# Patient Record
Sex: Female | Born: 1980 | Race: White | Hispanic: No | Smoking: Never smoker
Health system: Southern US, Community
[De-identification: ages and names within clinical notes are randomized; demographics above are authoritative.]

## PROBLEM LIST (undated history)

## (undated) ENCOUNTER — Inpatient Hospital Stay (HOSPITAL_COMMUNITY): Payer: Self-pay

## (undated) DIAGNOSIS — H9319 Tinnitus, unspecified ear: Secondary | ICD-10-CM

## (undated) DIAGNOSIS — D6852 Prothrombin gene mutation: Principal | ICD-10-CM

## (undated) DIAGNOSIS — D649 Anemia, unspecified: Secondary | ICD-10-CM

## (undated) DIAGNOSIS — I10 Essential (primary) hypertension: Secondary | ICD-10-CM

## (undated) DIAGNOSIS — Z789 Other specified health status: Secondary | ICD-10-CM

## (undated) DIAGNOSIS — R112 Nausea with vomiting, unspecified: Secondary | ICD-10-CM

## (undated) DIAGNOSIS — D682 Hereditary deficiency of other clotting factors: Secondary | ICD-10-CM

## (undated) DIAGNOSIS — E7212 Methylenetetrahydrofolate reductase deficiency: Secondary | ICD-10-CM

## (undated) DIAGNOSIS — Z9889 Other specified postprocedural states: Secondary | ICD-10-CM

## (undated) DIAGNOSIS — K219 Gastro-esophageal reflux disease without esophagitis: Secondary | ICD-10-CM

## (undated) DIAGNOSIS — O99281 Endocrine, nutritional and metabolic diseases complicating pregnancy, first trimester: Secondary | ICD-10-CM

## (undated) DIAGNOSIS — R7989 Other specified abnormal findings of blood chemistry: Secondary | ICD-10-CM

## (undated) DIAGNOSIS — D219 Benign neoplasm of connective and other soft tissue, unspecified: Secondary | ICD-10-CM

## (undated) DIAGNOSIS — D759 Disease of blood and blood-forming organs, unspecified: Secondary | ICD-10-CM

## (undated) DIAGNOSIS — N390 Urinary tract infection, site not specified: Secondary | ICD-10-CM

## (undated) HISTORY — PX: OTHER SURGICAL HISTORY: SHX169

## (undated) HISTORY — PX: WISDOM TOOTH EXTRACTION: SHX21

## (undated) HISTORY — DX: Benign neoplasm of connective and other soft tissue, unspecified: D21.9

## (undated) HISTORY — DX: Prothrombin gene mutation: D68.52

---
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
## 2004-09-11 ENCOUNTER — Encounter: Admission: RE | Admit: 2004-09-11 | Discharge: 2004-09-11 | Payer: Self-pay | Admitting: Obstetrics & Gynecology

## 2005-02-26 ENCOUNTER — Ambulatory Visit: Payer: Self-pay | Admitting: Cardiology

## 2005-02-26 ENCOUNTER — Ambulatory Visit: Payer: Self-pay | Admitting: Family Medicine

## 2005-03-26 ENCOUNTER — Encounter: Admission: RE | Admit: 2005-03-26 | Discharge: 2005-03-26 | Payer: Self-pay | Admitting: Family Medicine

## 2005-09-09 ENCOUNTER — Other Ambulatory Visit: Admission: RE | Admit: 2005-09-09 | Discharge: 2005-09-09 | Payer: Self-pay | Admitting: Obstetrics and Gynecology

## 2010-10-09 ENCOUNTER — Encounter: Payer: Self-pay | Admitting: Family Medicine

## 2010-10-09 ENCOUNTER — Ambulatory Visit (INDEPENDENT_AMBULATORY_CARE_PROVIDER_SITE_OTHER): Payer: 59 | Admitting: Family Medicine

## 2010-10-09 VITALS — BP 118/76 | Temp 98.1°F | Ht 64.0 in | Wt 266.4 lb

## 2010-10-09 DIAGNOSIS — R51 Headache: Secondary | ICD-10-CM

## 2010-10-09 NOTE — Patient Instructions (Signed)
We'll notify you of your CT results and determine what the next step is Call with any questions or concerns Hang in there!!!

## 2010-10-09 NOTE — Progress Notes (Signed)
  Subjective:    Patient ID: [name], female    DOB: 1980-06-04, 30 y.o.   MRN: [id number]  HPI New to establish care.  Last saw Dr Laury Axon ~6 yrs ago.  Seeing GYN- Lowell Guitar, [facility]  HA- woke up Monday and got up to use the bathroom, developed severe pain on L side of head.  Pain resolved w/ rest.  Pain recurred on Tuesday afternoon when pt laughed.  Yesterday when pt was leaning forward developed severe pain behind L eye.  No elevation in BP.  Denies HA in between painful episodes.  No nausea or vomiting.  Denies sinus pain/pressure.  Denies focal weakness or numbness.  Motion makes pain worse.  No hx of similar.  Denies family hx of brain tumor or other illness.   Review of Systems For ROS see HPI     Objective:   Physical Exam  Constitutional: She appears well-developed and well-nourished. No distress.  HENT:  Head: Normocephalic and atraumatic.  Right Ear: Tympanic membrane normal.  Left Ear: Tympanic membrane normal.  Nose: No mucosal edema or rhinorrhea. Right sinus exhibits no maxillary sinus tenderness and no frontal sinus tenderness. Left sinus exhibits no maxillary sinus tenderness and no frontal sinus tenderness.  Mouth/Throat: Oropharynx is clear and moist.  Eyes: Conjunctivae and EOM are normal. Pupils are equal, round, and reactive to light.       No obvious abnormalities of fundi  Neck: Normal range of motion. Neck supple. No thyromegaly present.  Cardiovascular: Normal rate, regular rhythm, normal heart sounds and intact distal pulses.   No murmur heard. Pulmonary/Chest: Effort normal and breath sounds normal. No respiratory distress. She has no wheezes.  Lymphadenopathy:    She has no cervical adenopathy.  Neurological: She is alert. She has normal strength and normal reflexes. She displays no tremor. No cranial nerve deficit or sensory deficit. She displays a negative Romberg sign. Coordination and gait normal.  Psychiatric:       Mildly anxious           Assessment & Plan:

## 2010-10-10 ENCOUNTER — Ambulatory Visit (HOSPITAL_BASED_OUTPATIENT_CLINIC_OR_DEPARTMENT_OTHER)
Admission: RE | Admit: 2010-10-10 | Discharge: 2010-10-10 | Disposition: A | Discharge status: Home / Self Care | Payer: 59 | Source: Ambulatory Visit | Attending: Family Medicine | Admitting: Family Medicine

## 2010-10-10 DIAGNOSIS — R51 Headache: Secondary | ICD-10-CM | POA: Insufficient documentation

## 2010-10-10 DIAGNOSIS — H547 Unspecified visual loss: Secondary | ICD-10-CM

## 2010-10-10 DIAGNOSIS — H538 Other visual disturbances: Secondary | ICD-10-CM | POA: Insufficient documentation

## 2010-10-13 ENCOUNTER — Telehealth: Payer: Self-pay | Admitting: *Deleted

## 2010-10-13 DIAGNOSIS — R51 Headache: Secondary | ICD-10-CM

## 2010-10-13 NOTE — Telephone Encounter (Signed)
Discuss with patient, Pt states that symptom still have not gotten better and would like to be referred. Pt notes that she had a episode on yesterday like a migraine with all the symptoms with no pain. Pt aware referral put in awaiting appt info   Nothing acute seen on MRI that would account for pain- this is good news! Small cyst seen on R (opposite side from pain) has been present since birth and is no cause for concern. If pain continues- will refer to neuro for further evaluation

## 2010-10-20 NOTE — Assessment & Plan Note (Signed)
Given acuteness and severity of episodes will send pt for brain imaging to r/o mass or other abnormality.  Neuro exam today WNL.  sxs not consistent w/ sinus problem.  Will follow closely.

## 2010-11-03 ENCOUNTER — Ambulatory Visit (INDEPENDENT_AMBULATORY_CARE_PROVIDER_SITE_OTHER): Payer: 59 | Admitting: Family Medicine

## 2010-11-03 DIAGNOSIS — N390 Urinary tract infection, site not specified: Secondary | ICD-10-CM

## 2010-11-03 DIAGNOSIS — R319 Hematuria, unspecified: Secondary | ICD-10-CM | POA: Insufficient documentation

## 2010-11-03 DIAGNOSIS — R5383 Other fatigue: Secondary | ICD-10-CM

## 2010-11-03 LAB — POCT URINALYSIS DIPSTICK
Blood, UA: NEGATIVE
Nitrite, UA: NEGATIVE
Protein, UA: NEGATIVE
Spec Grav, UA: 1.01
Urobilinogen, UA: 0.2
pH, UA: 5

## 2010-11-03 LAB — CBC WITH DIFFERENTIAL/PLATELET
Basophils Relative: 0.7 % (ref 0.0–3.0)
Eosinophils Relative: 2.7 % (ref 0.0–5.0)
HCT: 38.6 % (ref 36.0–46.0)
Hemoglobin: 13.1 g/dL (ref 12.0–15.0)
Lymphs Abs: 2.4 10*3/uL (ref 0.7–4.0)
MCHC: 34 g/dL (ref 30.0–36.0)
MCV: 81.5 fl (ref 78.0–100.0)
Monocytes Absolute: 0.5 10*3/uL (ref 0.1–1.0)
Neutro Abs: 5 10*3/uL (ref 1.4–7.7)
Neutrophils Relative %: 61.6 % (ref 43.0–77.0)
RBC: 4.73 Mil/uL (ref 3.87–5.11)
WBC: 8.1 10*3/uL (ref 4.5–10.5)

## 2010-11-03 NOTE — Assessment & Plan Note (Signed)
Pt's fatigue may be related to current UTI although sxs seem out of proportion to current illness.  Check labs to r/o underlying abnormality.

## 2010-11-03 NOTE — Assessment & Plan Note (Signed)
Pt already on Cipro for UTI and UA again shows leuks but no blood this time.  sxs are improving.  Will culture urine.  Pt to complete abx and call me if sxs change or worsen.

## 2010-11-03 NOTE — Progress Notes (Signed)
  Subjective:    Patient ID: [name], female    DOB: 11-28-1980, 30 y.o.   MRN: [id number]  HPI Hematuria- Friday woke up w/ severe pain in lower abd/pelvic.  Went to see GYN and was told she had kidney stone.  No CT, xray or US done.  Denies pain w/ urination.  Had microscopic blood in UA and was told she had UTI- started at Cipro.  Now having 'bloating' w/ urination and R sided low back pain.  Fatigue- 2 weeks of sxs.  Reports she will wake up, take a shower and then go back to bed.  This is not typical for pt.   Review of Systems For ROS see HPI     Objective:   Physical Exam  Constitutional: She appears well-developed and well-nourished. No distress.  HENT:  Head: Normocephalic and atraumatic.  Neck: Normal range of motion. Neck supple. No thyromegaly present.  Cardiovascular: Normal rate, regular rhythm, normal heart sounds and intact distal pulses.   Pulmonary/Chest: Effort normal and breath sounds normal. No respiratory distress.  Abdominal: Soft. Bowel sounds are normal. She exhibits no distension. There is no tenderness (suprapubic or CVA).  Lymphadenopathy:    She has no cervical adenopathy.  Skin: Skin is warm and dry.          Assessment & Plan:

## 2010-11-03 NOTE — Patient Instructions (Signed)
This appears to be a UTI We will culture your urine and call you with those results Finish the Cipro as directed Drink plenty of fluids Tylenol or ibuprofen as needed for pain If you have any questions or concerns- call me! Hang in there!!

## 2010-11-09 ENCOUNTER — Encounter: Payer: Self-pay | Admitting: *Deleted

## 2010-11-23 ENCOUNTER — Emergency Department (HOSPITAL_COMMUNITY): Payer: 59

## 2010-11-23 ENCOUNTER — Ambulatory Visit: Payer: 59 | Admitting: Family Medicine

## 2010-11-23 ENCOUNTER — Emergency Department (HOSPITAL_COMMUNITY)
Admission: EM | Admit: 2010-11-23 | Discharge: 2010-11-23 | Disposition: A | Discharge status: Home / Self Care | Payer: 59 | Attending: Emergency Medicine | Admitting: Emergency Medicine

## 2010-11-23 ENCOUNTER — Encounter (HOSPITAL_COMMUNITY): Payer: Self-pay | Admitting: Radiology

## 2010-11-23 DIAGNOSIS — N390 Urinary tract infection, site not specified: Secondary | ICD-10-CM | POA: Insufficient documentation

## 2010-11-23 DIAGNOSIS — R109 Unspecified abdominal pain: Secondary | ICD-10-CM | POA: Insufficient documentation

## 2010-11-23 DIAGNOSIS — N898 Other specified noninflammatory disorders of vagina: Secondary | ICD-10-CM | POA: Insufficient documentation

## 2010-11-23 LAB — COMPREHENSIVE METABOLIC PANEL
ALT: 18 U/L (ref 0–35)
AST: 25 U/L (ref 0–37)
Albumin: 3.9 g/dL (ref 3.5–5.2)
Alkaline Phosphatase: 81 U/L (ref 39–117)
Potassium: 4.6 mEq/L (ref 3.5–5.1)
Sodium: 136 mEq/L (ref 135–145)
Total Protein: 7.5 g/dL (ref 6.0–8.3)

## 2010-11-23 LAB — WET PREP, GENITAL

## 2010-11-23 LAB — URINE MICROSCOPIC-ADD ON

## 2010-11-23 LAB — DIFFERENTIAL
Basophils Absolute: 0.1 10*3/uL (ref 0.0–0.1)
Basophils Relative: 1 % (ref 0–1)
Eosinophils Relative: 2 % (ref 0–5)
Monocytes Absolute: 0.6 10*3/uL (ref 0.1–1.0)
Neutro Abs: 5.5 10*3/uL (ref 1.7–7.7)

## 2010-11-23 LAB — CBC
MCHC: 32.5 g/dL (ref 30.0–36.0)
RDW: 13.6 % (ref 11.5–15.5)

## 2010-11-23 LAB — URINALYSIS, ROUTINE W REFLEX MICROSCOPIC
Glucose, UA: NEGATIVE mg/dL
Hgb urine dipstick: NEGATIVE
Ketones, ur: NEGATIVE mg/dL
pH: 6 (ref 5.0–8.0)

## 2010-11-23 MED ORDER — IOHEXOL 300 MG/ML  SOLN
125.0000 mL | Freq: Once | INTRAMUSCULAR | Status: AC | PRN
  Administered 2010-11-23: 125 mL via INTRAVENOUS

## 2010-11-24 LAB — GC/CHLAMYDIA PROBE AMP, GENITAL
Chlamydia, DNA Probe: NEGATIVE
GC Probe Amp, Genital: NEGATIVE

## 2011-01-25 ENCOUNTER — Other Ambulatory Visit: Payer: Self-pay | Admitting: Obstetrics and Gynecology

## 2011-01-25 DIAGNOSIS — R102 Pelvic and perineal pain: Secondary | ICD-10-CM

## 2011-02-03 ENCOUNTER — Ambulatory Visit
Admission: RE | Admit: 2011-02-03 | Discharge: 2011-02-03 | Disposition: A | Discharge status: Home / Self Care | Payer: 59 | Source: Ambulatory Visit | Attending: Obstetrics and Gynecology | Admitting: Obstetrics and Gynecology

## 2011-02-03 DIAGNOSIS — R102 Pelvic and perineal pain: Secondary | ICD-10-CM

## 2011-02-03 MED ORDER — GADOBENATE DIMEGLUMINE 529 MG/ML IV SOLN
20.0000 mL | Freq: Once | INTRAVENOUS | Status: AC | PRN
  Administered 2011-02-03: 20 mL via INTRAVENOUS

## 2011-05-07 ENCOUNTER — Ambulatory Visit: Payer: 59 | Admitting: Internal Medicine

## 2011-06-06 ENCOUNTER — Inpatient Hospital Stay (HOSPITAL_COMMUNITY): Payer: 59

## 2011-06-06 ENCOUNTER — Inpatient Hospital Stay (HOSPITAL_COMMUNITY)
Admission: AD | Admit: 2011-06-06 | Discharge: 2011-06-06 | Disposition: A | Discharge status: Home / Self Care | Payer: 59 | Source: Ambulatory Visit | Attending: Obstetrics | Admitting: Obstetrics

## 2011-06-06 ENCOUNTER — Encounter (HOSPITAL_COMMUNITY): Payer: Self-pay | Admitting: *Deleted

## 2011-06-06 DIAGNOSIS — N949 Unspecified condition associated with female genital organs and menstrual cycle: Secondary | ICD-10-CM | POA: Insufficient documentation

## 2011-06-06 DIAGNOSIS — R109 Unspecified abdominal pain: Secondary | ICD-10-CM | POA: Insufficient documentation

## 2011-06-06 HISTORY — DX: Urinary tract infection, site not specified: N39.0

## 2011-06-06 HISTORY — DX: Benign neoplasm of connective and other soft tissue, unspecified: D21.9

## 2011-06-06 LAB — CBC
HCT: 39.4 % (ref 36.0–46.0)
MCHC: 33.5 g/dL (ref 30.0–36.0)
Platelets: 272 10*3/uL (ref 150–400)
RDW: 14.3 % (ref 11.5–15.5)
WBC: 13.5 10*3/uL — ABNORMAL HIGH (ref 4.0–10.5)

## 2011-06-06 LAB — URINALYSIS, ROUTINE W REFLEX MICROSCOPIC
Bilirubin Urine: NEGATIVE
Ketones, ur: NEGATIVE mg/dL
Nitrite: NEGATIVE
Urobilinogen, UA: 0.2 mg/dL (ref 0.0–1.0)
pH: 8.5 — ABNORMAL HIGH (ref 5.0–8.0)

## 2011-06-06 NOTE — ED Provider Notes (Signed)
History   Chronic pelvic pain with exacerbation  Chief Complaint  Patient presents with  . Abdominal Pain   HPI Note dictated  OB History    Grav Para Term Preterm Abortions TAB SAB Ect Mult Living   0               Past Medical History  Diagnosis Date  . Fibroids   . UTI (lower urinary tract infection)     Past Surgical History  Procedure Date  . Breast cyst removed   . Wisdom tooth extraction     Family History  Problem Relation Age of Onset  . Heart disease      grandfather  . Diabetes      grandfather  . Hypertension      grandfather  . Alcohol abuse Mother   . Alcohol abuse Maternal Grandfather     History  Substance Use Topics  . Smoking status: Never Smoker   . Smokeless tobacco: Not on file  . Alcohol Use: No     rarely    Allergies: No Known Allergies  Prescriptions prior to admission  Medication Sig Dispense Refill  . HYDROcodone-acetaminophen (VICODIN) 5-500 MG per tablet Take 1 tablet by mouth every 6 (six) hours as needed.        . traMADol (ULTRAM) 50 MG tablet Take 50 mg by mouth every 6 (six) hours as needed. For pain       . Meth-Hyo-M Bl-Na Phos-Ph Sal (URIBEL) 118 MG CAPS Take by mouth 3 (three) times daily.          ROS Physical Exam   Blood pressure 140/82, pulse 100, temperature 98.7 F (37.1 C), temperature source Oral, resp. rate 20, height 5\' 4"  (1.626 m), weight 118.842 kg (262 lb), last menstrual period 03/17/2011.  Physical Exam dictated  MAU Course  Procedures  MDM na  Assessment and Plan  Chronic pelvic pain with exacerbation UPT neg and UA neg DC home pending sono and CBC Has Tramadol and Vicodin for pain management Surgery for fibroid pending.  Jaisen Wiltrout J 06/06/2011, 4:43 PM

## 2011-06-06 NOTE — Progress Notes (Signed)
Onset of abdominal pain was seen at urgent care and was told does not have UTI, pain is bilateral lower abdominal pain, has history of pelvic pain, hurts when has bowel movement and urinating, LMP 03/15/11.

## 2011-06-06 NOTE — Progress Notes (Signed)
Pt c/o sever lower abd pain since yesterday morning that worsened throughout the day.  States she has muscle spasm pain that goes around to low back when it occurs. Pt took a tramadol and vicodin last night with minimal relief.  Went to urgent care yesterday and was treated with tramadol.

## 2011-06-07 NOTE — H&P (Signed)
[name], UMLAND                ACCOUNT NO.:  [id number]  MEDICAL RECORD NO.:  [id number]  LOCATION:  MV78                          FACILITY:  [facility]  PHYSICIAN:  Lenoard Aden, M.D.DATE OF BIRTH:  27-Jan-1981  DATE OF ADMISSION:  06/06/2011 DATE OF DISCHARGE:  06/06/2011                             HISTORY & PHYSICAL   CHIEF COMPLAINT:  Pelvic pain.  HISTORY OF PRESENT ILLNESS:  The patient is a 31 year old white female, G0 with a history of chronic pelvic pain, and a negative workup except for a known pedunculated fibroid which has been ongoing pain and discomfort over the last year.  She reports that the pain is a crampy pain that is intensified over the last day, unrelieved by tramadol.  She is a nonsmoker, nondrinker.  She denies domestic or physical violence.  Her medications include birth control pills, tramadol, and Vicodin p.r.n.  She has a history of a local excision of a breast cyst in 1997, otherwise no surgical history.  Family history of heart disease, hypertension, diabetes.  She has had no previous obstetric history.  Review of symptoms and review of present illness reveals a normal pelvic exam.  An MRI performed in August and September revealing only this exophytic fibroid as noted.  She has had negative cystoscopy and otherwise negative workup.  She was seen in Urgent Care 1 day ago with a negative pregnancy test and negative urinalysis.  Urine pregnancy test is negative today.  CBC and repeat ultrasound are pending.  PHYSICAL EXAMINATION:  GENERAL:  She is a well-developed, well- nourished, white female, in no acute distress. VITAL SIGNS:  Stable.  Patient is afebrile. HEENT:  Normal. NECK:  Supple.  Full range of motion. LUNGS:  Clear. HEART:  Regular rhythm. ABDOMEN:  Soft, obese nontender.  No CVA tenderness. EXTREMITIES:  There are no cords. NEUROLOGIC:  Nonfocal. June 06, 2011 SKIN:  Intact. PELVIC:  Reveals a normal size uterus with a  previously appreciated fullness consistent with fibroid slightly tender to palpation.  No ovarian masses are appreciated.  IMPRESSION:  Pelvic pain probably due to persistent degeneration and/or possible torsion of her fibroid.  The patient is currently scheduled for a da Vinci assisted myomectomy which has been scheduled in March per patient's decision.  PLAN:  We will check CBC, check ultrasound, probable discharge home with increased pain medication as noted.  We will follow up for preop visit as noted.     Lenoard Aden, M.D.     RJT/MEDQ  D:  06/06/2011  T:  06/07/2011  Job:  [id number]

## 2011-06-09 ENCOUNTER — Other Ambulatory Visit: Payer: Self-pay | Admitting: Obstetrics and Gynecology

## 2011-06-09 ENCOUNTER — Other Ambulatory Visit: Payer: 59

## 2011-06-09 DIAGNOSIS — R509 Fever, unspecified: Secondary | ICD-10-CM

## 2011-06-09 DIAGNOSIS — R110 Nausea: Secondary | ICD-10-CM

## 2011-06-09 DIAGNOSIS — R102 Pelvic and perineal pain: Secondary | ICD-10-CM

## 2011-06-10 ENCOUNTER — Other Ambulatory Visit: Payer: 59

## 2011-06-21 ENCOUNTER — Encounter (HOSPITAL_COMMUNITY): Payer: Self-pay | Admitting: Pharmacist

## 2011-06-29 ENCOUNTER — Encounter (HOSPITAL_COMMUNITY): Payer: Self-pay

## 2011-06-29 ENCOUNTER — Encounter (HOSPITAL_COMMUNITY)
Admission: RE | Admit: 2011-06-29 | Discharge: 2011-06-29 | Disposition: A | Discharge status: Home / Self Care | Payer: 59 | Source: Ambulatory Visit | Attending: Obstetrics and Gynecology | Admitting: Obstetrics and Gynecology

## 2011-06-29 ENCOUNTER — Other Ambulatory Visit: Payer: Self-pay | Admitting: Obstetrics and Gynecology

## 2011-06-29 HISTORY — DX: Other specified health status: Z78.9

## 2011-06-29 LAB — CBC
HCT: 37.5 % (ref 36.0–46.0)
MCHC: 32.3 g/dL (ref 30.0–36.0)
Platelets: 374 10*3/uL (ref 150–400)
RDW: 14.6 % (ref 11.5–15.5)
WBC: 9.8 10*3/uL (ref 4.0–10.5)

## 2011-06-29 LAB — SURGICAL PCR SCREEN
MRSA, PCR: NEGATIVE
Staphylococcus aureus: NEGATIVE

## 2011-06-29 NOTE — Patient Instructions (Addendum)
[address]  06/29/2011   Your procedure is scheduled on:  07/02/11  Enter through the Main Entrance of [facility] at 1030 AM.  Pick up the phone at the desk and dial [phone].   Call this number if you have problems the morning of surgery: [phone]   Remember:   Do not eat food:After Midnight.  Do not drink clear liquids: 6 Hours before arrival.  Take these medicines the morning of surgery with A SIP OF WATER: NA   Do not wear jewelry, make-up or nail polish.  Do not wear lotions, powders, or perfumes. You may wear deodorant.  Do not shave 48 hours prior to surgery.  Do not bring valuables to the hospital.  Contacts, dentures or bridgework may not be worn into surgery.  Leave suitcase in the car. After surgery it may be brought to your room.  For patients admitted to the hospital, checkout time is 11:00 AM the day of discharge.   Patients discharged the day of surgery will not be allowed to drive home.  Name and phone number of your driver: NA  Special Instructions: CHG Shower Use Special Wash: 1/2 bottle night before surgery and 1/2 bottle morning of surgery.   Please read over the following fact sheets that you were given: MRSA Information

## 2011-07-01 NOTE — H&P (Signed)
[name], [name]                ACCOUNT NO.:  [id number]  MEDICAL RECORD NO.:  [id number]  LOCATION:  SDC                           FACILITY:  [facility]  PHYSICIAN:  Lenoard Aden, M.D.DATE OF BIRTH:  31-Jan-1981  DATE OF ADMISSION:  06/29/2011 DATE OF DISCHARGE:  06/29/2011                             HISTORY & PHYSICAL   CHIEF COMPLAINT:  Symptomatic degenerative fibroid.  HISTORY OF PRESENT ILLNESS:  She is a 31 year old white female, G0, P0, who presents with worsening degenerative pain from a known fibroid now for definitive therapy.  MEDICATIONS:  Vicodin p.r.n. spread for pain as well and Phenergan for nausea p.r.n.  She is a nonsmoker and nondrinker.  She denies domestic or physical violence.  PAST SURGICAL HISTORY:  She has a surgical history remarkable for local excision of breast mass in 1997, which was benign.  FAMILY HISTORY:  She has a family history of heart disease, hypertension and diabetes.  PHYSICAL EXAMINATION:  GENERAL:  She is an obese white female, 63 inches in height and 267 pounds in weight. HEENT:  Normal. NECK:  Supple.  Full range of motion. LUNGS:  Clear. HEART:  Regular rate and rhythm. ABDOMEN:  Soft, nontender. PELVIC:  Midline tenderness and confirmation by ultrasound was 7 x 5 cm anterior fundal fibroid which appears to be pedunculated as documented by sonogram and MRI are still along the anterior fundal wall bilateral with normal adnexa. EXTREMITIES:  No cords. NEUROLOGIC:  Nonfocal. SKIN:  Intact.  IMPRESSION:  Symptomatic degenerative uterine fibroid by MRI with persistent pain requiring intermittent pain medication and therapy.  PLAN:  To proceed with da Vinci-assisted laparoscopic myomectomy.  Risks of anesthesia, infection, bleeding, injury to abdominal organs, need for repair is discussed, delayed versus immediate complications to include bowel and bladder injury noted.  The patient acknowledges and wishes  to proceed.     Lenoard Aden, M.D.     RJT/MEDQ  D:  07/01/2011  T:  07/01/2011  Job:  [id number]

## 2011-07-02 ENCOUNTER — Encounter (HOSPITAL_COMMUNITY): Payer: Self-pay

## 2011-07-02 ENCOUNTER — Encounter (HOSPITAL_COMMUNITY): Payer: Self-pay | Admitting: Anesthesiology

## 2011-07-02 ENCOUNTER — Ambulatory Visit (HOSPITAL_COMMUNITY): Payer: 59 | Admitting: Anesthesiology

## 2011-07-02 ENCOUNTER — Other Ambulatory Visit: Payer: Self-pay | Admitting: Obstetrics and Gynecology

## 2011-07-02 ENCOUNTER — Ambulatory Visit (HOSPITAL_COMMUNITY)
Admission: RE | Admit: 2011-07-02 | Discharge: 2011-07-03 | Disposition: A | Discharge status: Home / Self Care | Payer: 59 | Source: Ambulatory Visit | Attending: Obstetrics and Gynecology | Admitting: Obstetrics and Gynecology

## 2011-07-02 ENCOUNTER — Encounter (HOSPITAL_COMMUNITY)
Admission: RE | Disposition: A | Discharge status: Home / Self Care | Payer: Self-pay | Source: Ambulatory Visit | Attending: Obstetrics and Gynecology

## 2011-07-02 DIAGNOSIS — N736 Female pelvic peritoneal adhesions (postinfective): Secondary | ICD-10-CM | POA: Insufficient documentation

## 2011-07-02 DIAGNOSIS — Z01818 Encounter for other preprocedural examination: Secondary | ICD-10-CM | POA: Insufficient documentation

## 2011-07-02 DIAGNOSIS — Z01812 Encounter for preprocedural laboratory examination: Secondary | ICD-10-CM | POA: Insufficient documentation

## 2011-07-02 DIAGNOSIS — N838 Other noninflammatory disorders of ovary, fallopian tube and broad ligament: Secondary | ICD-10-CM | POA: Insufficient documentation

## 2011-07-02 DIAGNOSIS — D259 Leiomyoma of uterus, unspecified: Secondary | ICD-10-CM | POA: Insufficient documentation

## 2011-07-02 DIAGNOSIS — N803 Endometriosis of pelvic peritoneum, unspecified: Secondary | ICD-10-CM | POA: Insufficient documentation

## 2011-07-02 HISTORY — PX: ROBOT ASSISTED MYOMECTOMY: SHX5142

## 2011-07-02 LAB — HCG, SERUM, QUALITATIVE: Preg, Serum: NEGATIVE

## 2011-07-02 SURGERY — ROBOTIC ASSISTED MYOMECTOMY
Anesthesia: General | Site: Abdomen | Wound class: Clean Contaminated

## 2011-07-02 MED ORDER — HYDROMORPHONE 0.3 MG/ML IV SOLN
INTRAVENOUS | Status: DC
  Administered 2011-07-02: 0.599 mg via INTRAVENOUS
  Administered 2011-07-02: 16:00:00 via INTRAVENOUS
  Administered 2011-07-02: 1.19 mg via INTRAVENOUS
  Administered 2011-07-03: 0.8 mg via INTRAVENOUS
  Administered 2011-07-03: 0.9999 mg via INTRAVENOUS
  Administered 2011-07-03: 0.0799 mg via INTRAVENOUS

## 2011-07-02 MED ORDER — NALOXONE HCL 0.4 MG/ML IJ SOLN: 0.4000 mg | INTRAMUSCULAR | Status: DC | PRN

## 2011-07-02 MED ORDER — ACETAMINOPHEN 325 MG PO TABS: 325.0000 mg | ORAL_TABLET | ORAL | Status: DC | PRN

## 2011-07-02 MED ORDER — SODIUM CHLORIDE 0.9 % IJ SOLN: 9.0000 mL | INTRAMUSCULAR | Status: DC | PRN

## 2011-07-02 MED ORDER — SODIUM CHLORIDE BACTERIOSTATIC 0.9 % IJ SOLN: INTRAMUSCULAR | Status: DC | PRN

## 2011-07-02 MED ORDER — GLYCOPYRROLATE 0.2 MG/ML IJ SOLN
INTRAMUSCULAR | Status: AC
  Filled 2011-07-02: qty 2

## 2011-07-02 MED ORDER — CEFAZOLIN SODIUM 1-5 GM-% IV SOLN: 1.0000 g | INTRAVENOUS | Status: DC

## 2011-07-02 MED ORDER — DEXAMETHASONE SODIUM PHOSPHATE 10 MG/ML IJ SOLN
INTRAMUSCULAR | Status: AC
  Filled 2011-07-02: qty 1

## 2011-07-02 MED ORDER — SUFENTANIL CITRATE 50 MCG/ML IV SOLN
INTRAVENOUS | Status: DC | PRN
  Administered 2011-07-02 (×3): 10 ug via INTRAVENOUS
  Administered 2011-07-02: 20 ug via INTRAVENOUS

## 2011-07-02 MED ORDER — LIDOCAINE HCL (CARDIAC) 20 MG/ML IV SOLN
INTRAVENOUS | Status: DC | PRN
Start: 2011-07-02 — End: 2011-07-02
  Administered 2011-07-02: 100 mg via INTRAVENOUS

## 2011-07-02 MED ORDER — HYDROMORPHONE 0.3 MG/ML IV SOLN
INTRAVENOUS | Status: AC
  Filled 2011-07-02: qty 25

## 2011-07-02 MED ORDER — ROCURONIUM BROMIDE 100 MG/10ML IV SOLN
INTRAVENOUS | Status: DC | PRN
  Administered 2011-07-02: 10 mg via INTRAVENOUS
  Administered 2011-07-02: 50 mg via INTRAVENOUS

## 2011-07-02 MED ORDER — KETOROLAC TROMETHAMINE 30 MG/ML IJ SOLN: 15.0000 mg | Freq: Once | INTRAMUSCULAR | Status: DC | PRN

## 2011-07-02 MED ORDER — SODIUM CHLORIDE 0.9 % IJ SOLN
INTRAMUSCULAR | Status: DC | PRN
  Administered 2011-07-02: 10 mL

## 2011-07-02 MED ORDER — DIPHENHYDRAMINE HCL 50 MG/ML IJ SOLN: 12.5000 mg | Freq: Four times a day (QID) | INTRAMUSCULAR | Status: DC | PRN

## 2011-07-02 MED ORDER — ROCURONIUM BROMIDE 50 MG/5ML IV SOLN
INTRAVENOUS | Status: AC
  Filled 2011-07-02: qty 1

## 2011-07-02 MED ORDER — TRAMADOL HCL 50 MG PO TABS
50.0000 mg | ORAL_TABLET | Freq: Four times a day (QID) | ORAL | Status: DC | PRN
  Administered 2011-07-03: 50 mg via ORAL
  Filled 2011-07-02: qty 1

## 2011-07-02 MED ORDER — MIDAZOLAM HCL 5 MG/5ML IJ SOLN
INTRAMUSCULAR | Status: DC | PRN
  Administered 2011-07-02: 2 mg via INTRAVENOUS

## 2011-07-02 MED ORDER — PROMETHAZINE HCL 25 MG/ML IJ SOLN
INTRAMUSCULAR | Status: AC
  Filled 2011-07-02: qty 1

## 2011-07-02 MED ORDER — DEXTROSE-NACL 5-0.45 % IV SOLN
INTRAVENOUS | Status: DC
  Administered 2011-07-02: 21:00:00 via INTRAVENOUS

## 2011-07-02 MED ORDER — DIPHENHYDRAMINE HCL 12.5 MG/5ML PO ELIX: 12.5000 mg | ORAL_SOLUTION | Freq: Four times a day (QID) | ORAL | Status: DC | PRN

## 2011-07-02 MED ORDER — KETOROLAC TROMETHAMINE 60 MG/2ML IM SOLN
INTRAMUSCULAR | Status: DC | PRN
  Administered 2011-07-02: 30 mg via INTRAMUSCULAR

## 2011-07-02 MED ORDER — ONDANSETRON HCL 4 MG/2ML IJ SOLN: 4.0000 mg | Freq: Four times a day (QID) | INTRAMUSCULAR | Status: DC | PRN

## 2011-07-02 MED ORDER — FENTANYL CITRATE 0.05 MG/ML IJ SOLN: 25.0000 ug | INTRAMUSCULAR | Status: DC | PRN

## 2011-07-02 MED ORDER — BUPIVACAINE HCL (PF) 0.25 % IJ SOLN
INTRAMUSCULAR | Status: DC | PRN
  Administered 2011-07-02: 15 mL

## 2011-07-02 MED ORDER — BUPIVACAINE HCL (PF) 0.25 % IJ SOLN
INTRAMUSCULAR | Status: AC
  Filled 2011-07-02: qty 30

## 2011-07-02 MED ORDER — KETOROLAC TROMETHAMINE 60 MG/2ML IM SOLN
INTRAMUSCULAR | Status: AC
  Filled 2011-07-02: qty 2

## 2011-07-02 MED ORDER — CEFAZOLIN SODIUM 1-5 GM-% IV SOLN
INTRAVENOUS | Status: AC
  Administered 2011-07-02: 1 g via INTRAVENOUS
  Filled 2011-07-02: qty 50

## 2011-07-02 MED ORDER — PROPOFOL 10 MG/ML IV EMUL
INTRAVENOUS | Status: AC
  Filled 2011-07-02: qty 40

## 2011-07-02 MED ORDER — ONDANSETRON HCL 4 MG/2ML IJ SOLN
INTRAMUSCULAR | Status: AC
  Filled 2011-07-02: qty 2

## 2011-07-02 MED ORDER — KETOROLAC TROMETHAMINE 30 MG/ML IJ SOLN
INTRAMUSCULAR | Status: DC | PRN
  Administered 2011-07-02: 30 mg via INTRAVENOUS

## 2011-07-02 MED ORDER — NEOSTIGMINE METHYLSULFATE 1 MG/ML IJ SOLN
INTRAMUSCULAR | Status: DC | PRN
  Administered 2011-07-02: 3 mg via INTRAVENOUS

## 2011-07-02 MED ORDER — VASOPRESSIN 20 UNIT/ML IJ SOLN
INTRAMUSCULAR | Status: AC
  Filled 2011-07-02: qty 1

## 2011-07-02 MED ORDER — ZOLPIDEM TARTRATE 5 MG PO TABS: 5.0000 mg | ORAL_TABLET | Freq: Every evening | ORAL | Status: DC | PRN

## 2011-07-02 MED ORDER — LACTATED RINGERS IV SOLN
INTRAVENOUS | Status: DC
  Administered 2011-07-02: 125 mL/h via INTRAVENOUS
  Administered 2011-07-02: 12:00:00 via INTRAVENOUS

## 2011-07-02 MED ORDER — SUFENTANIL CITRATE 50 MCG/ML IV SOLN
INTRAVENOUS | Status: AC
  Filled 2011-07-02: qty 1

## 2011-07-02 MED ORDER — OXYCODONE-ACETAMINOPHEN 5-325 MG PO TABS
1.0000 | ORAL_TABLET | ORAL | Status: DC | PRN
  Administered 2011-07-03: 1 via ORAL
  Filled 2011-07-02: qty 1

## 2011-07-02 MED ORDER — ONDANSETRON HCL 4 MG/2ML IJ SOLN
INTRAMUSCULAR | Status: DC | PRN
  Administered 2011-07-02: 4 mg via INTRAVENOUS

## 2011-07-02 MED ORDER — LIDOCAINE HCL (CARDIAC) 20 MG/ML IV SOLN
INTRAVENOUS | Status: AC
  Filled 2011-07-02: qty 5

## 2011-07-02 MED ORDER — DEXAMETHASONE SODIUM PHOSPHATE 10 MG/ML IJ SOLN
INTRAMUSCULAR | Status: DC | PRN
  Administered 2011-07-02: 10 mg via INTRAVENOUS

## 2011-07-02 MED ORDER — NEOSTIGMINE METHYLSULFATE 1 MG/ML IJ SOLN
INTRAMUSCULAR | Status: AC
  Filled 2011-07-02: qty 10

## 2011-07-02 MED ORDER — PROPOFOL 10 MG/ML IV EMUL
INTRAVENOUS | Status: DC | PRN
  Administered 2011-07-02: 50 mg via INTRAVENOUS
  Administered 2011-07-02: 200 mg via INTRAVENOUS

## 2011-07-02 MED ORDER — GLYCOPYRROLATE 0.2 MG/ML IJ SOLN
INTRAMUSCULAR | Status: DC | PRN
  Administered 2011-07-02: .6 mg via INTRAVENOUS

## 2011-07-02 MED ORDER — MIDAZOLAM HCL 2 MG/2ML IJ SOLN
INTRAMUSCULAR | Status: AC
  Filled 2011-07-02: qty 2

## 2011-07-02 MED ORDER — ARTIFICIAL TEARS OP OINT
TOPICAL_OINTMENT | OPHTHALMIC | Status: DC | PRN
  Administered 2011-07-02: 1 via OPHTHALMIC

## 2011-07-02 MED ORDER — VASOPRESSIN 20 UNIT/ML IJ SOLN: INTRAMUSCULAR | Status: DC | PRN

## 2011-07-02 MED ORDER — PROMETHAZINE HCL 25 MG/ML IJ SOLN
6.2500 mg | INTRAMUSCULAR | Status: DC | PRN
  Administered 2011-07-02: 12.5 mg via INTRAVENOUS

## 2011-07-02 SURGICAL SUPPLY — 68 items
ADH SKN CLS APL DERMABOND .7 (GAUZE/BANDAGES/DRESSINGS) ×1
BARRIER ADHS 3X4 INTERCEED (GAUZE/BANDAGES/DRESSINGS) ×3 IMPLANT
BLADE LAP MORCELLATOR 15X9.5 (ELECTROSURGICAL) ×1 IMPLANT
BLADELESS LONG 8MM (BLADE) ×2 IMPLANT
BRR ADH 4X3 ABS CNTRL BYND (GAUZE/BANDAGES/DRESSINGS) ×2
CABLE HIGH FREQUENCY MONO STRZ (ELECTRODE) ×2 IMPLANT
CLOTH BEACON ORANGE TIMEOUT ST (SAFETY) ×2 IMPLANT
CONT PATH 16OZ SNAP LID 3702 (MISCELLANEOUS) ×2 IMPLANT
CONTAINER PREFILL 10% NBF 60ML (FORM) ×2 IMPLANT
COVER MAYO STAND STRL (DRAPES) ×2 IMPLANT
COVER TABLE BACK 60X90 (DRAPES) ×4 IMPLANT
COVER TIP SHEARS 8 DVNC (MISCELLANEOUS) ×1 IMPLANT
COVER TIP SHEARS 8MM DA VINCI (MISCELLANEOUS) ×1
DECANTER SPIKE VIAL GLASS SM (MISCELLANEOUS) ×2 IMPLANT
DERMABOND ADVANCED (GAUZE/BANDAGES/DRESSINGS) ×1
DERMABOND ADVANCED .7 DNX12 (GAUZE/BANDAGES/DRESSINGS) ×1 IMPLANT
DRAPE HUG U DISPOSABLE (DRAPE) ×2 IMPLANT
DRAPE LG THREE QUARTER DISP (DRAPES) ×4 IMPLANT
DRAPE MONITOR DA VINCI (DRAPE) IMPLANT
DRAPE WARM FLUID 44X44 (DRAPE) ×2 IMPLANT
DRESSING TELFA 8X3 (GAUZE/BANDAGES/DRESSINGS) ×1 IMPLANT
ELECT REM PT RETURN 9FT ADLT (ELECTROSURGICAL) ×2
ELECTRODE REM PT RTRN 9FT ADLT (ELECTROSURGICAL) ×1 IMPLANT
EVACUATOR SMOKE 8.L (FILTER) ×2 IMPLANT
GAUZE VASELINE 3X9 (GAUZE/BANDAGES/DRESSINGS) IMPLANT
GLOVE BIO SURGEON STRL SZ7.5 (GLOVE) ×4 IMPLANT
GOWN PREVENTION PLUS XLARGE (GOWN DISPOSABLE) ×2 IMPLANT
GOWN STRL REIN XL XLG (GOWN DISPOSABLE) ×12 IMPLANT
GYRUS RUMI II 2.5CM BLUE (DISPOSABLE)
GYRUS RUMI II 3.5CM BLUE (DISPOSABLE)
GYRUS RUMI II 4.0CM BLUE (DISPOSABLE)
KIT ACCESSORY DA VINCI DISP (KITS) ×1
KIT ACCESSORY DVNC DISP (KITS) ×1 IMPLANT
KIT DISP ACCESSORY 4 ARM (KITS) IMPLANT
MANIPULATOR UTERINE 4.5 ZUMI (MISCELLANEOUS) ×1 IMPLANT
NDL INSUFFLATION 14GA 120MM (NEEDLE) ×1 IMPLANT
NEEDLE INSUFFLATION 14GA 120MM (NEEDLE) ×2 IMPLANT
NS IRRIG 1000ML POUR BTL (IV SOLUTION) ×6 IMPLANT
PACK LAVH (CUSTOM PROCEDURE TRAY) ×2 IMPLANT
PAD PREP 24X48 CUFFED NSTRL (MISCELLANEOUS) ×4 IMPLANT
POSITIONER SURGICAL ARM (MISCELLANEOUS) ×4 IMPLANT
RUMI II 3.0CM BLUE KOH-EFFICIE (DISPOSABLE) IMPLANT
RUMI II GYRUS 2.5CM BLUE (DISPOSABLE) IMPLANT
RUMI II GYRUS 3.5CM BLUE (DISPOSABLE) IMPLANT
RUMI II GYRUS 4.0CM BLUE (DISPOSABLE) IMPLANT
SET IRRIG TUBING LAPAROSCOPIC (IRRIGATION / IRRIGATOR) ×2 IMPLANT
SOLUTION ELECTROLUBE (MISCELLANEOUS) ×2 IMPLANT
SPONGE LAP 18X18 X RAY DECT (DISPOSABLE) IMPLANT
SUT VIC AB 0 CT1 27 (SUTURE)
SUT VIC AB 0 CT1 27XBRD ANTBC (SUTURE) IMPLANT
SUT VICRYL 0 UR6 27IN ABS (SUTURE) ×2 IMPLANT
SUT VICRYL RAPIDE 4/0 PS 2 (SUTURE) ×2 IMPLANT
SYR 50ML LL SCALE MARK (SYRINGE) ×2 IMPLANT
SYRINGE 10CC LL (SYRINGE) ×2 IMPLANT
SYSTEM CONVERTIBLE TROCAR (TROCAR) IMPLANT
TIP UTERINE 5.1X6CM LAV DISP (MISCELLANEOUS) IMPLANT
TIP UTERINE 6.7X10CM GRN DISP (MISCELLANEOUS) IMPLANT
TIP UTERINE 6.7X6CM WHT DISP (MISCELLANEOUS) IMPLANT
TIP UTERINE 6.7X8CM BLUE DISP (MISCELLANEOUS) IMPLANT
TOWEL OR 17X24 6PK STRL BLUE (TOWEL DISPOSABLE) ×5 IMPLANT
TRAY FOLEY BAG SILVER LF 14FR (CATHETERS) ×2 IMPLANT
TROCAR DISP BLADELESS 8 DVNC (TROCAR) ×1 IMPLANT
TROCAR DISP BLADELESS 8MM (TROCAR) ×1
TROCAR XCEL 12X100 BLDLESS (ENDOMECHANICALS) ×2 IMPLANT
TROCAR Z-THREAD 12X150 (TROCAR) ×2 IMPLANT
TROCAR Z-THREAD FIOS 12X100MM (TROCAR) ×2 IMPLANT
TUBING FILTER THERMOFLATOR (ELECTROSURGICAL) ×2 IMPLANT
WARMER LAPAROSCOPE (MISCELLANEOUS) ×2 IMPLANT

## 2011-07-02 NOTE — Op Note (Signed)
07/02/2011  2:20 PM  PATIENT:  [name]  31 y.o. 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 Morcellation Excision of pelvic endometriosis Removal to Left paratubal cyst  SURGEON:  Ichiro Chesnut  ASSISTANTS: Paul   ANESTHESIA:   local and general  ESTIMATED BLOOD LOSS: * No blood loss amount entered *   DRAINS: Urinary Catheter (Foley)   LOCAL MEDICATIONS USED:  MARCAINE 10CC  SPECIMEN:  Source of Specimen:  fibroid, paratubal cyst, culdesac lesion  DISPOSITION OF SPECIMEN:  PATHOLOGY  COUNTS:  YES  DICTATION #: [id number]  PLAN OF CARE: DC in am  PATIENT DISPOSITION:  PACU - hemodynamically stable.

## 2011-07-02 NOTE — Anesthesia Postprocedure Evaluation (Signed)
Anesthesia Post Note  Patient: [name]  Procedure(s) Performed:  ROBOTIC ASSISTED MYOMECTOMY - With Removal Of Paratubal Cyst; Cul De Sac Biopsy  Anesthesia type: General  Patient location: PACU  Post pain: Pain level controlled  Post assessment: Post-op Vital signs reviewed  Last Vitals:  Filed Vitals:   07/02/11 1515  BP: 126/70  Pulse: 68  Temp: 36.4 C  Resp: 16    Post vital signs: Reviewed  Level of consciousness: sedated  Complications: No apparent anesthesia complications

## 2011-07-02 NOTE — Transfer of Care (Signed)
Immediate Anesthesia Transfer of Care Note  Patient: [name]  Procedure(s) Performed:  ROBOTIC ASSISTED MYOMECTOMY - With Removal Of Paratubal Cyst; Cul De Sac Biopsy  Patient Location: PACU  Anesthesia Type: General  Level of Consciousness: oriented and sedated  Airway & Oxygen Therapy: Patient Spontanous Breathing and Patient connected to nasal cannula oxygen  Post-op Assessment: Report given to PACU RN and Post -op Vital signs reviewed and stable  Post vital signs: stable  Complications: No apparent anesthesia complications

## 2011-07-02 NOTE — Progress Notes (Signed)
Patient ID: [name], female   DOB: Mar 12, 1981, 31 y.o.   MRN: [id number] Patient seen and examined. Consent witnessed and signed. No changes noted. Update completed.

## 2011-07-02 NOTE — Anesthesia Preprocedure Evaluation (Signed)
Anesthesia Evaluation  Patient identified by MRN, date of birth, ID band Patient awake    Reviewed: Allergy & Precautions, H&P , Patient's Chart, lab work & pertinent test results, reviewed documented beta blocker date and time   History of Anesthesia Complications Negative for: history of anesthetic complications  Airway Mallampati: III TM Distance: >3 FB Neck ROM: full    Dental No notable dental hx.    Pulmonary neg pulmonary ROS,  clear to auscultation  Pulmonary exam normal       Cardiovascular Exercise Tolerance: Good neg cardio ROS regular Normal    Neuro/Psych  Headaches, Negative Neurological ROS  Negative Psych ROS   GI/Hepatic negative GI ROS, Neg liver ROS,   Endo/Other  Negative Endocrine ROSMorbid obesity  Renal/GU negative Renal ROS     Musculoskeletal   Abdominal   Peds  Hematology negative hematology ROS (+)   Anesthesia Other Findings   Reproductive/Obstetrics negative OB ROS                           Anesthesia Physical Anesthesia Plan  ASA: III  Anesthesia Plan: General ETT   Post-op Pain Management:    Induction:   Airway Management Planned:   Additional Equipment:   Intra-op Plan:   Post-operative Plan:   Informed Consent: I have reviewed the patients History and Physical, chart, labs and discussed the procedure including the risks, benefits and alternatives for the proposed anesthesia with the patient or authorized representative who has indicated his/her understanding and acceptance.   Dental Advisory Given  Plan Discussed with: CRNA and Surgeon  Anesthesia Plan Comments:         Anesthesia Quick Evaluation

## 2011-07-03 LAB — CBC
HCT: 38.2 % (ref 36.0–46.0)
Hemoglobin: 12.2 g/dL (ref 12.0–15.0)
MCHC: 31.9 g/dL (ref 30.0–36.0)
MCV: 81.6 fL (ref 78.0–100.0)
RDW: 14.4 % (ref 11.5–15.5)
WBC: 17.6 10*3/uL — ABNORMAL HIGH (ref 4.0–10.5)

## 2011-07-03 MED ORDER — DOXYCYCLINE HYCLATE 50 MG PO CAPS: 100.0000 mg | ORAL_CAPSULE | Freq: Two times a day (BID) | ORAL | Status: AC

## 2011-07-03 NOTE — Progress Notes (Signed)
D/C instructions reviewed with pt. And family.  Both state understanding of same. No home equipment needed.  Wheelchair to car with staff without incident.  D/c'd home with family.

## 2011-07-03 NOTE — Op Note (Signed)
[name], [name]                ACCOUNT NO.:  [id number]  MEDICAL RECORD NO.:  [id number]  LOCATION:  9305                          FACILITY:  [facility]  PHYSICIAN:  Lenoard Aden, M.D.DATE OF BIRTH:  1981/02/25  DATE OF PROCEDURE:  07/02/2011 DATE OF DISCHARGE:                              OPERATIVE REPORT   DESCRIPTION OF PROCEDURE:  After being apprised of risks of anesthesia, infection, bleeding, injury to abdominal organs, need for repair, delayed versus immediate complications include bowel, bladder injury, possible need for repair, the patient was brought to the operating room and was administered a general anesthetic without complications. Prepped and draped in usual sterile fashion.  Feet were placed in Yellofin stirrups.  Exam under anesthesia reveals midline pelvic fullness.  The adnexa difficult to palpate due to patient's body habitus.  The ZUMI retractor was placed per vagina without difficulty. At this time, attention was turned to the abdominal portion and the Foley catheter was placed.  At this time, attention was turned to the abdominal portion of the procedure whereby infraumbilical incision made with a scalpel.  Long Veress needle placed opening pressure of 2 is noted.  Pressures is 20 and 5.  4 L of CO2 was insufflated.  Trocar was placed.  Visualization reveals atraumatic trocar entry.  However revisualization reveals extensive adhesions of the omentum and bowel mesentery to the anterior abdominal wall, and in the midline to amass which is consistent with a known pedunculated anterior fibroid.  The robotic arms were placed two on the right, two 8-mm ports and one 8-mm port, a 5-mm assistant port on the left.  The robot was then docked.  PK forceps, scissors, and graspers were placed through the operative ports. At this time, the adhesions were extensively lysed off the anterior abdominal wall, exposing the midline mass which was also encased in peritoneal  adhesions.  These are sharply and bluntly dissected circumferentially revealing this pedunculated fibroid which had necrosed at its base and almost completely detached from its attachment to the uterus had parasitized to these components in the abdomen.  In addition, the left tube was adherent and parasitizing its blood flow to this fibroid.  This was gently dissected away were bowel adhesions were also removed from the left adnexa exposing the pelvis in total at this time, the left tube was gently swept away and inspected.  Normal fimbria and no other inflammation was present.  There was no evidence of tubal disruption or tubal occlusion.  At this time, the fibroids isolated at the base and separated and placed in the anterior cul-de-sac.  The base was then cauterized with a flat surface defect noted bladder anterior cul-de-sac shows a fair amount of inflammation but no lesions were noted.  Posterior cul-de-sac shows some questionable endometriotic lesions which were excised sharply there was a paratubal cyst, which was adherent to the left ovarian fossa which was excised and removed using sharp dissection.  The cul-de-sac lesion was also excised in total using sharp dissection.  All these were sent to Pathology.  At this time, irrigation was accomplished.  The hemostasis was achieved where the fibroid bed was.  All bowel areas and all adhesions  appeared to be hemostatic.  Irrigation was accomplished, copiously.  No other endometriotic implants were noted.  At this time, the robot was undocked and morcellation is performed for complete removal of the fibroid weighing 90 g.  Interceed was then placed on the defect where the fibroid had been attached to the uterus, both tubes, otherwise appearing normal with a completely normal right tube.  There was endometriosis on the right ovary, clear vesicular lesions which were cauterized and on the left, as well.  At this time, good hemostasis was  achieved. All trocars were removed under direct visualization.  CO2 released incisions were closed using 0 Vicryl 4-0 Vicryl, and Dermabond.  The patient tolerated the procedure well, was awakened, and transferred to recovery in good condition.  The Foley catheter, indwelling ZUMI retractor removed.     Lenoard Aden, M.D.     RJT/MEDQ  D:  07/02/2011  T:  07/03/2011  Job:  [id number]

## 2011-07-03 NOTE — Plan of Care (Signed)
Problem: Phase II Progression Outcomes Goal: Pain controlled on oral analgesia Outcome: Not Progressing Patient is on PCA Dilaudid Goal: Progress activity as tolerated unless otherwise ordered Outcome: Completed/Met Date Met:  07/03/11 Patient has been up in the halls to walk since 2000 pm Goal: Afebrile, VS remain stable Outcome: Completed/Met Date Met:  07/03/11 Vital signs stable at this point

## 2011-07-03 NOTE — Progress Notes (Signed)
1 Day Post-Op Procedure(s):23 hour observation ROBOTIC ASSISTED MYOMECTOMY  Subjective: Patient reports incisional pain, tolerating PO, + flatus and no problems voiding.    Objective: I have reviewed patient's vital signs, intake and output, medications and labs.  General: alert, cooperative, appears stated age, no distress and moderately obese Resp: clear to auscultation bilaterally and normal percussion bilaterally Cardio: regular rate and rhythm, S1, S2 normal, no murmur, click, rub or gallop GI: soft, non-tender; bowel sounds normal; no masses,  no organomegaly and abnormal findings:  distended, hypoactive bowel sounds and obese Extremities: extremities normal, atraumatic, no cyanosis or edema Vaginal Bleeding: minimal  Assessment: s/p Procedure(s): ROBOTIC ASSISTED MYOMECTOMY: stable  Plan: DC home  Inflammation of ? etx with leukocytosis Has Tramadol and Vicodin at home Add doxycycline x 7d Fu office 2 weeks.  LOS: 1 day    Cherell Colvin J 07/03/2011, 10:36 AM

## 2011-07-05 ENCOUNTER — Encounter (HOSPITAL_COMMUNITY): Payer: Self-pay | Admitting: Obstetrics and Gynecology

## 2012-02-22 ENCOUNTER — Ambulatory Visit (INDEPENDENT_AMBULATORY_CARE_PROVIDER_SITE_OTHER): Payer: 59 | Admitting: Family Medicine

## 2012-02-22 VITALS — BP 138/86 | HR 88 | Temp 98.5°F | Resp 16 | Ht 64.0 in | Wt 268.0 lb

## 2012-02-22 DIAGNOSIS — Z111 Encounter for screening for respiratory tuberculosis: Secondary | ICD-10-CM

## 2012-02-22 NOTE — Progress Notes (Signed)
Cc: here for TB screening  HPI: Wants to be foster parent , here for PPD screening. No sxs. See below. Denies any SI/HI, depression. Denies any physical limitations.   ROS: Deneis fevers, chills, N/v, HA, abd pain, weakness, numbness tingling, blood in urine or stool  PE:  Obese white Female HEENT nl RRR, no MRG Clear bilaterally to ausculatition Soft, nontender, + BS, no masses MSK nl Gross CN 2-12 nl  Tuberculosis Risk Questionnaire  1. Were you born outside the [place] in one of the following parts of the world:    [place], [place], [place], [place] or [place]?  No  2. Have you traveled outside the [place] and lived for more than one month in one of the following parts of the world:  [place], [place], [place], [place] or [place]?  No  3. Do you have a compromised immune system such as from any of the following conditions:  HIV/AIDS, organ or bone marrow transplantation, diabetes, immunosuppressive   medicines (e.g. Prednisone, Remicaide), leukemia, lymphoma, cancer of the   head or neck, gastrectomy or jejunal bypass, end-stage renal disease (on   dialysis), or silicosis?  No    4. Have you ever done one of the following:    Used crack cocaine, injected illegal drugs, worked or resided in jail or prison,   worked or resided at a homeless shelter, or worked as a Research scientist (physical sciences) in   direct contact with patients?  No  5. Have you ever been exposed to anyone with infectious tuberculosis?  No Tuberculosis Symptom Questionnaire  Do you currently have any of the following symptoms?  1. Unexplained cough lasting more than 3 weeks? No  Unexplained fever lasting more than 3 weeks. No   3. Night Sweats (sweating that leaves the bedclothes and sheets wet)   No  4. Shortness of Breath No  5. Chest Pain No  6. Unintentional weight loss  No  7. Unexplained fatigue (very tired for no reason) No  TB screening--low risk based on  stratification Will not place PPD on patient due to tuberculin shortage Form filled out for foster parenting

## 2012-02-27 ENCOUNTER — Ambulatory Visit (INDEPENDENT_AMBULATORY_CARE_PROVIDER_SITE_OTHER): Payer: 59 | Admitting: Family Medicine

## 2012-02-27 ENCOUNTER — Ambulatory Visit: Payer: 59

## 2012-02-27 VITALS — BP 132/86 | HR 75 | Temp 98.4°F | Resp 17 | Ht 64.5 in | Wt 260.0 lb

## 2012-02-27 DIAGNOSIS — J9801 Acute bronchospasm: Secondary | ICD-10-CM

## 2012-02-27 DIAGNOSIS — J209 Acute bronchitis, unspecified: Secondary | ICD-10-CM

## 2012-02-27 MED ORDER — PREDNISONE 20 MG PO TABS: ORAL_TABLET | ORAL | Status: DC

## 2012-02-27 MED ORDER — ALBUTEROL SULFATE HFA 108 (90 BASE) MCG/ACT IN AERS: 2.0000 | INHALATION_SPRAY | Freq: Four times a day (QID) | RESPIRATORY_TRACT | Status: DC | PRN

## 2012-02-27 MED ORDER — ALBUTEROL SULFATE (2.5 MG/3ML) 0.083% IN NEBU
2.5000 mg | INHALATION_SOLUTION | Freq: Once | RESPIRATORY_TRACT | Status: AC
  Administered 2012-02-27: 2.5 mg via RESPIRATORY_TRACT

## 2012-02-27 MED ORDER — HYDROCOD POLST-CHLORPHEN POLST 10-8 MG/5ML PO LQCR: 5.0000 mL | Freq: Two times a day (BID) | ORAL | Status: DC | PRN

## 2012-02-27 MED ORDER — DOXYCYCLINE HYCLATE 100 MG PO CAPS: 100.0000 mg | ORAL_CAPSULE | Freq: Two times a day (BID) | ORAL | Status: DC

## 2012-02-27 MED ORDER — FLUCONAZOLE 150 MG PO TABS: 150.0000 mg | ORAL_TABLET | Freq: Once | ORAL | Status: DC

## 2012-02-27 NOTE — Progress Notes (Signed)
[address]   [place], [place]  [address]   [phone]  Subjective:    Patient ID: [name], female    DOB: [birth year], 31 y.o.   MRN: [id number]  HPIThis 31 y.o. female presents for evaluation of congestion.  Onset ten days ago.  No fever +chills/ no sweats.  +HA; +L ear pain; +ST from coughing.  +rhinorrhea; +nasall congestion mild clear.  +PND.  +coughing horribly; no sputum. Lots of nighttime coughing; +post-tussive emesis.  No diarrhea; +nausea; no vomiting.  No rash.  Taking Cheratussin and Delsym without improvement; Mucinex without imrpovement.  Last night, mother in law had one teaspoon with Tussionex.  Taking Allegra but stopped due to lack of improvement.  Currently on menses.    PMH:  Allergic Rhinitis Psurg:  1. Myomectomy  2.  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  No tobacco.  Engaged.    Review of Systems  Constitutional: Positive for chills and fatigue. Negative for fever and diaphoresis.  HENT: Positive for ear pain, congestion, sore throat, rhinorrhea, voice change, postnasal drip and sinus pressure. Negative for trouble swallowing.   Respiratory: Positive for cough, shortness of breath and wheezing.   Gastrointestinal: Positive for nausea. Negative for vomiting and abdominal pain.  Skin: Negative for rash.       Objective:   Physical Exam  Nursing note and vitals reviewed. Constitutional: She is oriented to person, place, and time. She appears well-developed and well-nourished. No distress.  HENT:  Head: Normocephalic and atraumatic.  Right Ear: External ear normal.  Left Ear: External ear normal.  Nose: Nose normal.  Mouth/Throat: Oropharynx is clear and moist.  Eyes: Conjunctivae normal are normal. Pupils are equal, round, and reactive to light.  Neck: Normal range of motion. Neck supple.  Cardiovascular: Normal rate, regular rhythm and normal heart sounds.   No murmur heard. Pulmonary/Chest:  Effort normal. No respiratory distress. She has wheezes. She has no rales.       +wheezing on forced expiration only.  Lymphadenopathy:    She has no cervical adenopathy.  Neurological: She is alert and oriented to person, place, and time.  Skin: Skin is warm and dry. No rash noted. She is not diaphoretic.  Psychiatric: She has a normal mood and affect. Her behavior is normal.    [facility] reading (PRIMARY) by  Dr. Katrinka Blazing.  CXR: NAD.  ALBUTEROL NEBULIZER ADMINISTERED DURING VISIT.    Assessment & Plan:   1. Bronchospasm  DG Chest 2 View, albuterol (PROVENTIL) (2.5 MG/3ML) 0.083% nebulizer solution 2.5 mg, albuterol (PROVENTIL HFA;VENTOLIN HFA) 108 (90 BASE) MCG/ACT inhaler, predniSONE (DELTASONE) 20 MG tablet  2.  Acute Bronchitis   1.  Acute Bronchitis:  New.  CXR negative for infiltrate.  Treat with Doxycycline 100mg  bid x 7-10 days.  Rx for Tussionex cough syrup.  Supportive care with rest, fluids, Tylenol or Motrin. RTC for acute worsening. 2. Bronchospasm: New.  No history of asthma.  S/p nebulizer treatment in office. Rx for Prednisone taper, Albuterol HFA to use scheduled qid for next week and then PRN.    Meds ordered this encounter  Medications  . albuterol (PROVENTIL) (2.5 MG/3ML) 0.083% nebulizer solution 2.5 mg    Sig:   . albuterol (PROVENTIL HFA;VENTOLIN HFA) 108 (90 BASE) MCG/ACT inhaler    Sig: Inhale 2 puffs into the lungs every 6 (six) hours as needed for wheezing (cough, shortness of breath or wheezing.).  Dispense:  1 Inhaler    Refill:  1  . predniSONE (DELTASONE) 20 MG tablet    Sig: 3 daily x 1 day, then 2 daily x 5 days, then 1 daily x 4 days    Dispense:  17 tablet    Refill:  0  . chlorpheniramine-HYDROcodone (TUSSIONEX PENNKINETIC ER) 10-8 MG/5ML LQCR    Sig: Take 5 mLs by mouth every 12 (twelve) hours as needed (cough).    Dispense:  115 mL    Refill:  0  . doxycycline (VIBRAMYCIN) 100 MG capsule    Sig: Take 1 capsule (100 mg total) by mouth 2 (two)  times daily.    Dispense:  20 capsule    Refill:  0  . fluconazole (DIFLUCAN) 150 MG tablet    Sig: Take 1 tablet (150 mg total) by mouth once. PLEASE PUT ON HOLD.    Dispense:  2 tablet    Refill:  0

## 2012-02-27 NOTE — Patient Instructions (Addendum)
1. Bronchospasm  DG Chest 2 View, albuterol (PROVENTIL) (2.5 MG/3ML) 0.083% nebulizer solution 2.5 mg, albuterol (PROVENTIL HFA;VENTOLIN HFA) 108 (90 BASE) MCG/ACT inhaler, predniSONE (DELTASONE) 20 MG tablet  2. Acute bronchitis  doxycycline (VIBRAMYCIN) 100 MG capsule

## 2012-03-01 NOTE — Progress Notes (Signed)
Reviewed and agree.

## 2013-08-29 LAB — HM PAP SMEAR: HM Pap smear: NORMAL

## 2013-11-26 ENCOUNTER — Ambulatory Visit: Payer: 59 | Admitting: Family Medicine

## 2013-12-06 ENCOUNTER — Ambulatory Visit (INDEPENDENT_AMBULATORY_CARE_PROVIDER_SITE_OTHER): Payer: 59 | Admitting: General Surgery

## 2013-12-07 ENCOUNTER — Other Ambulatory Visit (INDEPENDENT_AMBULATORY_CARE_PROVIDER_SITE_OTHER): Payer: Self-pay

## 2013-12-07 ENCOUNTER — Encounter (INDEPENDENT_AMBULATORY_CARE_PROVIDER_SITE_OTHER): Payer: Self-pay | Admitting: Surgery

## 2013-12-07 ENCOUNTER — Other Ambulatory Visit (INDEPENDENT_AMBULATORY_CARE_PROVIDER_SITE_OTHER): Payer: Self-pay | Admitting: Surgery

## 2013-12-07 ENCOUNTER — Ambulatory Visit (INDEPENDENT_AMBULATORY_CARE_PROVIDER_SITE_OTHER): Payer: 59 | Admitting: Surgery

## 2013-12-07 DIAGNOSIS — E669 Obesity, unspecified: Secondary | ICD-10-CM | POA: Insufficient documentation

## 2013-12-07 DIAGNOSIS — J45909 Unspecified asthma, uncomplicated: Secondary | ICD-10-CM | POA: Insufficient documentation

## 2013-12-07 LAB — COMPREHENSIVE METABOLIC PANEL
ALT: 13 U/L (ref 0–35)
AST: 13 U/L (ref 0–37)
Albumin: 4.2 g/dL (ref 3.5–5.2)
Alkaline Phosphatase: 79 U/L (ref 39–117)
BILIRUBIN TOTAL: 0.3 mg/dL (ref 0.2–1.2)
BUN: 11 mg/dL (ref 6–23)
CO2: 26 mEq/L (ref 19–32)
CREATININE: 0.52 mg/dL (ref 0.50–1.10)
Calcium: 9.3 mg/dL (ref 8.4–10.5)
Chloride: 104 mEq/L (ref 96–112)
Glucose, Bld: 99 mg/dL (ref 70–99)
Potassium: 3.8 mEq/L (ref 3.5–5.3)
SODIUM: 139 meq/L (ref 135–145)
TOTAL PROTEIN: 6.8 g/dL (ref 6.0–8.3)

## 2013-12-07 NOTE — Patient Instructions (Signed)
Sleeve Gastrectomy A sleeve gastrectomy is a surgery in which a large portion of the stomach is removed. After the surgery, the stomach will be a narrow tube about the size of a banana. This surgery is performed to help a person lose weight. The person loses weight because the reduced size of the stomach restricts the amount of food that the person can eat. The stomach will hold much less food than before the surgery. Also, the part of the stomach that is removed produces a hormone that causes hunger.  This surgery is done for people who have morbid obesity, defined as a body mass index (BMI) greater than 40. BMI is an estimate of body fat and is calculated from the height and weight of a person. This surgery may also be done for people with a BMI between 35 and 40 if they have other diseases, such as type 2 diabetes mellitus, obstructive sleep apnea, or heart and lung disorders (cardiopulmonary diseases).  LET YOUR HEALTH CARE PROVIDER KNOW ABOUT:  Any allergies you have.   All medicines you are taking, including vitamins, herbs, eyedrops, creams, and over-the-counter medicines.   Use of steroids (by mouth or creams).   Previous problems you or members of your family have had with the use of anesthetics.   Any blood disorders you have.   Previous surgeries you have had.   Possibility of pregnancy, if this applies.   Other health problems you have. RISKS AND COMPLICATIONS Generally, sleeve gastrectomy is a safe procedure. However, as with any procedure, complications can occur. Possible complications include:  Infection.  Bleeding.  Blood clots.  Damage to other organs or tissue.  Leakage of fluid from the stomach into the abdominal cavity (rare). BEFORE THE PROCEDURE  You may need to have blood tests and imaging tests (such as X-rays or ultrasonography) done before the day of surgery. A test to evaluate your esophagus and how it moves (esophageal manometry) may also be  done.  You may be placed on a liquid diet 2-3 weeks before the surgery.  Ask your health care provider about changing or stopping your regular medicines.  Do not eat or drink anything for at least 8 hours before the procedure.   Make plans to have someone drive you home after your hospital stay. Also arrange for someone to help you with activities during recovery. PROCEDURE  A laparoscopic technique is usually used for this surgery:  You will be given medicine to make you sleep through the procedure (general anesthetic). This medicine will be given through an intravenous (IV) access tube that is put into one of your veins.  Once you are asleep, your abdomen will be cleaned and sterilized.  Several small incisions will be made in your abdomen.  Your abdomen will be filled with air so that it expands. This gives the surgeon more room to operate and makes your organs easier to see.  A thin, lighted tube with a tiny camera on the end (laparoscope) is put through a small incision in your abdomen. The camera on the laparoscope sends a picture to a TV screen in the operating room. This gives the surgeon a good view inside the abdomen.  Hollow tubes are put through the other small incisions in your abdomen. The tools needed for the procedure are put through these tubes.  The surgeon uses staples to divide part of the stomach and then removes it through one of the incisions.  The remaining stomach may be reinforced using stitches   or surgical glue or both to prevent leakage of the stomach contents. A small tube (drain) may be placed through one of the incisions to allow extra fluid to flow from the area.  The incisions are closed with stitches, staples, or glue. AFTER THE PROCEDURE  You will be monitored closely in a recovery area. Once the anesthetic has worn off, you will likely be moved to a regular hospital room.  You will be given medicine for pain and nausea.   You may have a drain  from one of the incisions in your abdomen. If a drain is used, it may stay in place after you go home from the hospital and be removed at a follow-up appointment.   You will be encouraged to walk around several times a day. This helps prevent blood clots.  You will be started on a liquid diet the first day after your surgery. Sometimes a test is done to check for leaking before you can eat.  You will be urged to cough and do deep breathing exercises. This helps prevent a lung infection after a surgery.  You will likely need to stay in the hospital for a few days.  Document Released: 03/14/2009 Document Revised: 01/17/2013 Document Reviewed: 09/29/2012 Exit[employer] Patient Information 2015 [employer]. This information is not intended to replace advice given to you by your health care provider. Make sure you discuss any questions you have with your health care provider.  

## 2013-12-07 NOTE — Progress Notes (Signed)
Chief Complaint:  Morbid obesity BMI 48  History of Present Illness:  [name] is an 33 y.o. 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 She has tried different diets with limited success and has researched bariatric surgery and wants to have a sleeve gastrectomy. She is aware of the risk and benefits of the surgery with complications including but not limited to bleeding at the staple line and leaks at the staple. We'll proceed with bariatric workup. She has tried multiple diets with some success and has gained most of her weight since college. She used to play high school soccer. Her best friend had a gastric bypass last year which lead her to get interested in bariatric surgery. After attending our seminar she became interested in sleeve gastrectomy.   Past Medical History  Diagnosis Date  . Fibroids   . UTI (lower urinary tract infection)   . No pertinent past medical history     Past Surgical History  Procedure Laterality Date  . Breast cyst removed    . Wisdom tooth extraction    . Robot assisted myomectomy  07/02/2011    Procedure: ROBOTIC ASSISTED MYOMECTOMY;  Surgeon: Lovenia Kim, MD;  Location: [facility];  Service: Gynecology;  Laterality: N/A;  With Removal Of Paratubal Cyst; Cul De Sac Biopsy    Current Outpatient Prescriptions  Medication Sig Dispense Refill  . albuterol (PROVENTIL HFA;VENTOLIN HFA) 108 (90 BASE) MCG/ACT inhaler Inhale 2 puffs into the lungs every 6 (six) hours as needed for wheezing (cough, shortness of breath or wheezing.).  1 Inhaler  1  . chlorpheniramine-HYDROcodone (TUSSIONEX PENNKINETIC ER) 10-8 MG/5ML LQCR Take 5 mLs by mouth every 12 (twelve) hours as needed (cough).  115 mL  0  . doxycycline (VIBRAMYCIN) 100 MG capsule Take 1 capsule (100 mg total) by mouth 2 (two) times daily.  20 capsule  0  . fluconazole (DIFLUCAN) 150 MG tablet Take 1 tablet (150 mg total) by mouth  once. PLEASE PUT ON HOLD.  2 tablet  0  . HYDROcodone-acetaminophen (VICODIN) 5-500 MG per tablet Take 1 tablet by mouth every 6 (six) hours as needed.        Marland Kitchen Ketorolac Tromethamine 15.75 MG/SPRAY SOLN Place 15.75 mg into the nose daily.      . predniSONE (DELTASONE) 20 MG tablet 3 daily x 1 day, then 2 daily x 5 days, then 1 daily x 4 days  17 tablet  0  . traMADol (ULTRAM) 50 MG tablet Take 50 mg by mouth every 6 (six) hours as needed. For pain        No current facility-administered medications for this visit.   Review of patient's allergies indicates no known allergies. Family History  Problem Relation Age of Onset  . Heart disease      grandfather  . Diabetes      grandfather  . Hypertension      grandfather  . Alcohol abuse Mother   . Alcohol abuse Maternal Grandfather    Social History:   reports that she has never smoked. She does not have any smokeless tobacco history on file. She reports that she drinks alcohol. She reports that she does not use illicit drugs.   REVIEW OF SYSTEMS : Positive for reactive airway disease ; otherwise negative  Physical Exam:   Blood pressure 130/68, pulse 76, resp. rate 18, height 5\' 4"  (1.626 m), weight 282 lb 9.6 oz (  128.187 kg). Body mass index is 48.48 kg/(m^2).  Gen:  WDWN WF NAD  Neurological: Alert and oriented to person, place, and time. Motor and sensory function is grossly intact  Head: Normocephalic and atraumatic.  Eyes: Conjunctivae are normal. Pupils are equal, round, and reactive to light. No scleral icterus.  Neck: Normal range of motion. Neck supple. No tracheal deviation or thyromegaly present.  Cardiovascular:  SR without murmurs or gallops.  No carotid bruits Breast:  Not examined Respiratory: Effort normal.  No respiratory distress. No chest wall tenderness. Breath sounds normal.  No wheezes, rales or rhonchi.  Abdomen:  Obese and nontender GU:  Not examined Musculoskeletal: Normal range of motion. Extremities are  nontender. No cyanosis, edema or clubbing noted Lymphadenopathy: No cervical, preauricular, postauricular or axillary adenopathy is present Skin: Skin is warm and dry. No rash noted. No diaphoresis. No erythema. No pallor. Pscyh: Normal mood and affect. Behavior is normal. Judgment and thought content normal.   LABORATORY RESULTS: No results found for this or any previous visit (from the past 48 hour(s)).   RADIOLOGY RESULTS: No results found.  Problem List: Patient Active Problem List   Diagnosis Date Noted  . Morbid obesity-BMI 48 12/07/2013  . Reactive airway disease 12/07/2013    Assessment & Plan: Morbid obesity with BMI 45.  Will begin workup toward sleeve gastrectomy    Matt B. Hassell Done, MD, [facility] [phone] beeper [phone]  12/07/2013 11:44 AM

## 2013-12-11 NOTE — Addendum Note (Signed)
Addended by: Ivor Costa on: 12/11/2013 12:23 PM   Modules accepted: Orders

## 2013-12-12 ENCOUNTER — Ambulatory Visit: Payer: 59 | Admitting: Family Medicine

## 2013-12-26 ENCOUNTER — Ambulatory Visit (INDEPENDENT_AMBULATORY_CARE_PROVIDER_SITE_OTHER): Payer: 59 | Admitting: Family Medicine

## 2013-12-26 ENCOUNTER — Encounter: Payer: Self-pay | Admitting: Family Medicine

## 2013-12-26 NOTE — Assessment & Plan Note (Signed)
Ongoing issue for pt.  Is interested in gastric sleeve.  Had recent labs done at surgery office- results not available to view.  Pt has meeting upcoming w/ nutritionist.  Strongly encouraged her to get regular activity in addition to healthy food choices.  Will continue to follow pt's progress.

## 2013-12-26 NOTE — Progress Notes (Signed)
   Subjective:    Patient ID: [name], female    DOB: 1980/07/19, 33 y.o.   MRN: [id number]  HPI Pt is considering bariatric surgery.  Wants 'pre-op clearance'.  Plan is for her to have surgery 6 months from now after documented weight loss attempts.  Is seeing nutritionist.  No regular exercise.   Review of Systems Pt denies CP.  Will have palpitations intermittently at night prior to bed.  + SOB w/ exertion, not at rest.  No HAs, visual changes, abd pain, N/V/D.    Objective:   Physical Exam  Vitals reviewed. Constitutional: She is oriented to person, place, and time. She appears well-developed and well-nourished. No distress.  HENT:  Head: Normocephalic and atraumatic.  Mouth/Throat: Oropharynx is clear and moist.  Neck: Normal range of motion. Neck supple. No thyromegaly present.  Cardiovascular: Normal rate, regular rhythm, normal heart sounds and intact distal pulses.   Pulmonary/Chest: Effort normal and breath sounds normal. No respiratory distress. She has no wheezes. She has no rales.  Musculoskeletal: She exhibits no edema.  Lymphadenopathy:    She has no cervical adenopathy.  Neurological: She is alert and oriented to person, place, and time. No cranial nerve deficit. Coordination normal.  Skin: Skin is warm and dry.  Psychiatric: She has a normal mood and affect. Her behavior is normal. Thought content normal.          Assessment & Plan:

## 2013-12-26 NOTE — Progress Notes (Signed)
Pre visit review using our clinic review tool, if applicable. No additional management support is needed unless otherwise documented below in the visit note. 

## 2013-12-26 NOTE — Patient Instructions (Signed)
Schedule your complete physical in 6 months (this can also serve as your pre-op clearance) Continue to make healthy food choices and get regular exercise If the anxiety or palpitations become more bothersome- let me know! Call with any questions or concerns Welcome Back!!!

## 2014-01-10 ENCOUNTER — Encounter (HOSPITAL_COMMUNITY): Admission: RE | Payer: Self-pay | Source: Ambulatory Visit

## 2014-01-10 ENCOUNTER — Ambulatory Visit (HOSPITAL_COMMUNITY): Admission: RE | Admit: 2014-01-10 | Payer: 59 | Source: Ambulatory Visit | Admitting: Surgery

## 2014-01-10 SURGERY — BREATH TEST, FOR HELICOBACTER PYLORI

## 2014-01-16 ENCOUNTER — Ambulatory Visit: Payer: 59 | Admitting: Dietician

## 2014-01-24 ENCOUNTER — Ambulatory Visit (HOSPITAL_COMMUNITY): Payer: 59

## 2014-01-24 ENCOUNTER — Other Ambulatory Visit (HOSPITAL_COMMUNITY): Payer: 59

## 2014-07-03 ENCOUNTER — Encounter: Payer: 59 | Admitting: Family Medicine

## 2015-06-01 NOTE — L&D Delivery Note (Signed)
Delivery Note At 5:45 AM a non-viable female was delivered via Vaginal, Spontaneous Delivery (Presentation: vertex  ).  APGAR: 0, 0; weight 1 lb 12.2 oz (800 g).   Placenta status: retained  Cord:  with the following complications:na .  Cord pH: na  Anesthesia:  IV Episiotomy: None Lacerations:   Suture Repair: na Est. Blood Loss (mL): 100 TO OR for retained placenta.    Mom to OR.  Baby to [place].  Kiyra Slaubaugh J 04/10/2016, 10:17 AM

## 2015-09-09 ENCOUNTER — Other Ambulatory Visit (HOSPITAL_COMMUNITY): Payer: Self-pay | Admitting: Obstetrics and Gynecology

## 2015-09-09 DIAGNOSIS — D252 Subserosal leiomyoma of uterus: Secondary | ICD-10-CM

## 2015-09-10 ENCOUNTER — Ambulatory Visit (HOSPITAL_COMMUNITY): Payer: Self-pay

## 2015-09-11 ENCOUNTER — Ambulatory Visit (HOSPITAL_COMMUNITY): Payer: Self-pay

## 2015-09-12 ENCOUNTER — Ambulatory Visit (HOSPITAL_COMMUNITY)
Admission: RE | Admit: 2015-09-12 | Discharge: 2015-09-12 | Disposition: A | Discharge status: Home / Self Care | Payer: Self-pay | Source: Ambulatory Visit | Attending: Obstetrics and Gynecology | Admitting: Obstetrics and Gynecology

## 2015-09-12 DIAGNOSIS — D252 Subserosal leiomyoma of uterus: Secondary | ICD-10-CM

## 2015-09-12 DIAGNOSIS — N979 Female infertility, unspecified: Secondary | ICD-10-CM | POA: Insufficient documentation

## 2015-09-12 MED ORDER — IOPAMIDOL (ISOVUE-300) INJECTION 61%
30.0000 mL | Freq: Once | INTRAVENOUS | Status: AC | PRN
  Administered 2015-09-12: 5 mL

## 2016-04-06 ENCOUNTER — Inpatient Hospital Stay (HOSPITAL_COMMUNITY)
Admission: AD | Admit: 2016-04-06 | Discharge: 2016-04-06 | Disposition: A | Discharge status: Home / Self Care | Payer: No Typology Code available for payment source | Source: Ambulatory Visit | Attending: Obstetrics and Gynecology | Admitting: Obstetrics and Gynecology

## 2016-04-06 ENCOUNTER — Encounter (HOSPITAL_COMMUNITY): Payer: Self-pay | Admitting: *Deleted

## 2016-04-06 ENCOUNTER — Inpatient Hospital Stay (HOSPITAL_COMMUNITY): Payer: No Typology Code available for payment source

## 2016-04-06 DIAGNOSIS — Z3A18 18 weeks gestation of pregnancy: Secondary | ICD-10-CM | POA: Insufficient documentation

## 2016-04-06 DIAGNOSIS — O42912 Preterm premature rupture of membranes, unspecified as to length of time between rupture and onset of labor, second trimester: Secondary | ICD-10-CM | POA: Diagnosis not present

## 2016-04-06 LAB — CBC WITH DIFFERENTIAL/PLATELET
BASOS ABS: 0 10*3/uL (ref 0.0–0.1)
Basophils Relative: 0 %
EOS ABS: 0.2 10*3/uL (ref 0.0–0.7)
Eosinophils Relative: 1 %
HCT: 36.3 % (ref 36.0–46.0)
HEMOGLOBIN: 12.4 g/dL (ref 12.0–15.0)
Lymphocytes Relative: 17 %
Lymphs Abs: 2.3 10*3/uL (ref 0.7–4.0)
MCH: 27.7 pg (ref 26.0–34.0)
MCHC: 34.2 g/dL (ref 30.0–36.0)
MCV: 81.2 fL (ref 78.0–100.0)
Monocytes Absolute: 0.5 10*3/uL (ref 0.1–1.0)
Monocytes Relative: 3 %
NEUTROS ABS: 10.6 10*3/uL — AB (ref 1.7–7.7)
Neutrophils Relative %: 79 %
Platelets: 247 10*3/uL (ref 150–400)
RBC: 4.47 MIL/uL (ref 3.87–5.11)
RDW: 14.9 % (ref 11.5–15.5)
WBC: 13.5 10*3/uL — ABNORMAL HIGH (ref 4.0–10.5)

## 2016-04-06 LAB — POCT FERN TEST: POCT Fern Test: POSITIVE

## 2016-04-06 LAB — GROUP B STREP BY PCR: GROUP B STREP BY PCR: POSITIVE — AB

## 2016-04-06 MED ORDER — AZITHROMYCIN 1 G PO PACK
1.0000 g | PACK | Freq: Once | ORAL | Status: AC
  Administered 2016-04-06: 1 g via ORAL
  Filled 2016-04-06: qty 1

## 2016-04-06 MED ORDER — AMOXICILLIN 500 MG PO CAPS
500.0000 mg | ORAL_CAPSULE | Freq: Once | ORAL | Status: AC
  Administered 2016-04-06: 500 mg via ORAL
  Filled 2016-04-06: qty 1

## 2016-04-06 MED ORDER — AMOXICILLIN 500 MG PO CAPS: 500.0000 mg | ORAL_CAPSULE | Freq: Three times a day (TID) | ORAL | 0 refills | Status: AC

## 2016-04-06 NOTE — MAU Note (Signed)
PT  SAYS   SHE WAS ON  HER WAY HOME  FROM  WORK AND FELT A BALLOON  POP   AND  SOMETHING  RAN DOWN  HER  LEG. -    AT 650PM.    SHE  CALLED  DR Dexter.       IN TRIAGE  - ON  UNDERWEAR-    BLACK -   BUT  WITH   KLEENX-    WET  - NO BLOOD   .     NO CRAMPS-     FELT  PRESSURE  YESTERDAY -   HAS    SOME  TODAY.

## 2016-04-06 NOTE — MAU Note (Signed)
Pt c/o feeling "pop" while driving home around [phone]. Was one time, but it saturated underwear. Denies pain or vag bleeding. Denies urinary s/s.

## 2016-04-06 NOTE — MAU Note (Signed)
Urine in lab 

## 2016-04-06 NOTE — MAU Provider Note (Signed)
History     CSN: [id number]  Arrival date and time: 04/06/16 [id number]   First Provider Initiated Contact with Patient 04/06/16 2032      Chief Complaint  Patient presents with  . Rupture of Membranes   [name] is a 35 y.o. G1P0 at [redacted]w[redacted]d presenting with leakage of vaginal fluid. She states that 3 hr PTA  while driving she felt a pop followed by fluid running down her legs and soaking her clothing.Some leakage continued for several minutes, with another spurt as she got out of the car on arrival here. She denies abdominal pain or cramps. No vaginal bleeding or irritative vaginal discharge. Last intercourse 2 weeks ago.  Pregnancy course uncomplicated with negative cultures about a month ago.        Past Medical History:  Diagnosis Date  . Fibroids   . No pertinent past medical history   . UTI (lower urinary tract infection)     Past Surgical History:  Procedure Laterality Date  . breast cyst removed    . ROBOT ASSISTED MYOMECTOMY  07/02/2011   Procedure: ROBOTIC ASSISTED MYOMECTOMY;  Surgeon: Lovenia Kim, MD;  Location: [facility];  Service: Gynecology;  Laterality: N/A;  With Removal Of Paratubal Cyst; Cul De Sac Biopsy  . WISDOM TOOTH EXTRACTION      Family History  Problem Relation Age of Onset  . Alcohol abuse Mother   . Heart disease      grandfather  . Diabetes      grandfather  . Hypertension      grandfather  . Alcohol abuse Maternal Grandfather     Social History  Substance Use Topics  . Smoking status: Never Smoker  . Smokeless tobacco: Never Used  . Alcohol use Yes     Comment: rarely    Allergies: No Known Allergies  Prescriptions Prior to Admission  Medication Sig Dispense Refill Last Dose  . calcium carbonate (TUMS - DOSED IN MG ELEMENTAL CALCIUM) 500 MG chewable tablet Chew 1 tablet by mouth daily.   Past Week at Unknown time  . Prenatal Vit-Fe Fumarate-FA (PRENATAL MULTIVITAMIN) TABS tablet Take 1 tablet by mouth daily at 12 noon.    04/05/2016 at Unknown time  . albuterol (PROVENTIL HFA;VENTOLIN HFA) 108 (90 BASE) MCG/ACT inhaler Inhale 2 puffs into the lungs every 6 (six) hours as needed for wheezing (cough, shortness of breath or wheezing.). 1 Inhaler 1 Taking  . diphenhydrAMINE (BENADRYL) 25 mg capsule Take 25 mg by mouth every 6 (six) hours as needed.   More than a month at Unknown time    Review of Systems  Constitutional: Negative for chills and fever.  Gastrointestinal: Negative for abdominal pain, constipation, diarrhea, nausea and vomiting.  Genitourinary: Negative for dysuria, flank pain, frequency, hematuria and urgency.  Psychiatric/Behavioral: The patient is nervous/anxious.    Physical Exam   Blood pressure 142/81, pulse 88, temperature 98.4 F (36.9 C), temperature source Oral, resp. rate 18, height 5\' 4"  (1.626 m), weight 120.4 kg (265 lb 8 oz), last menstrual period 10/30/2015. Vitals:   04/06/16 2007 04/06/16 2053  BP: 142/81 126/68  Pulse: 88 87  Resp: 18 18  Temp: 98.4 F (36.9 C)   TempSrc: Oral   Weight: 120.4 kg (265 lb 8 oz)   Height: 5\' 4"  (1.626 m)    Physical Exam  Nursing note and vitals reviewed. Constitutional: She is oriented to person, place, and time. She appears well-developed and well-nourished. No distress.  HENT:  Head: Normocephalic.  Eyes: Pupils are equal, round, and reactive to light.  Neck: Normal range of motion.  Cardiovascular: Normal rate.   Respiratory: Effort normal.  GI: Soft. There is no tenderness. There is no rebound and no guarding.  S=D DT FHR 155  Genitourinary:  Genitourinary Comments: Sterile spec exam: Small amount pooling present with scant mucusy discharge. No blood. Cx appears long/closed Fern positive  Neurological: She is alert and oriented to person, place, and time.  Skin: Skin is warm and dry.  Psychiatric: She has a normal mood and affect. Her behavior is normal.    MAU Course  Procedures Results for orders placed or performed  during the hospital encounter of 04/06/16 (from the past 24 hour(s))  Fern Test     Status: None   Collection Time: 04/06/16  9:00 PM  Result Value Ref Range   POCT Fern Test Positive = ruptured amniotic membanes   CBC with Differential/Platelet     Status: Abnormal (Preliminary result)   Collection Time: 04/06/16  9:14 PM  Result Value Ref Range   WBC 13.5 (H) 4.0 - 10.5 K/uL   RBC 4.47 3.87 - 5.11 MIL/uL   Hemoglobin 12.4 12.0 - 15.0 g/dL   HCT 36.3 36.0 - 46.0 %   MCV 81.2 78.0 - 100.0 fL   MCH 27.7 26.0 - 34.0 pg   MCHC 34.2 30.0 - 36.0 g/dL   RDW 14.9 11.5 - 15.5 %   Platelets 247 150 - 400 K/uL   Neutrophils Relative % 79 %   Neutro Abs 10.6 (H) 1.7 - 7.7 K/uL   Lymphocytes Relative 17 %   Lymphs Abs 2.3 0.7 - 4.0 K/uL   Monocytes Relative 3 %   Monocytes Absolute 0.5 0.1 - 1.0 K/uL   Eosinophils Relative 1 %   Eosinophils Absolute 0.2 0.0 - 0.7 K/uL   Basophils Relative 0 %   Basophils Absolute 0.0 0.0 - 0.1 K/uL   Other PENDING %  Prelim [place]: Oligohydramnios, largest pocket 1.94cm, CL 3cm  MDM Consulted Dr. Ronita Hipps who will see patient  Assessment and Plan  G1 at [redacted]w[redacted]d PPROM  [name] 04/06/2016, 8:34 PM

## 2016-04-06 NOTE — Discharge Instructions (Signed)
What is preterm premature rupture of membranes?   -- The medical term for when a woman's water breaks is "rupture of membranes." Premature rupture of membranes is when a woman's water breaks before she goes into labor. Doctors and midwives call premature rupture of membranes "PROM" for short. "Preterm" PROM is when this happens when a pregnancy is less than 37 weeks.  Preterm PROM is a problem because labor often begins soon after it happens. Babies who are born before 97 weeks of pregnancy can have serious health problems.  PROM can also lead to problems in the mother. For example, it can lead to an infection in the uterus.  What causes preterm PROM? -- Doctors aren't sure why preterm PROM happens in some women and not others. But preterm PROM is more likely to happen in women who:  1.Had preterm PROM before  2.Had preterm labor and delivery before  3.Have an infection in the vagina or uterus  4. Have bleeding from the vagina  5. Smoke   What are the symptoms of preterm PROM? -- When a woman's water breaks, it can feel like a sudden gush or a slow trickle of fluid from the vagina. The fluid is clear or pale yellow and sometimes looks like urine.  Is there a test for preterm PROM? -- Yes. Your doctor, nurse, or midwife will use a speculum to examine your cervix. He or she will look to see if amniotic fluid is leaking from your cervix. He or she will also take a sample of the fluid in your vagina, look at it under a microscope, and test it with special paper. This is to make sure the fluid is amniotic fluid.  Your doctor or midwife might also do an ultrasound exam to check the amount of amniotic fluid around your baby. An ultrasound is an imaging test that uses sound waves to create pictures of your baby in your uterus.  How is preterm PROM treated? -- Almost all women with preterm PROM need to stay in the hospital until their baby is born. That's so their doctor or midwife can follow their  pregnancy closely.  In many cases, labor starts within 1 week of preterm PROM.  If your labor doesn't start on its own, your doctor or midwife might give you medicine to help start it. This is called inducing labor. Your doctor or midwife is more likely to induce labor if:  1. You are 34 or more weeks pregnant.  2. You are less than [redacted] weeks pregnant, but there is a problem with your pregnancy or your baby's health. The most common problem that might happen is an infection in the uterus.   If your doctor or midwife doesn't deliver your baby right away, he or she might treat you with medicines, including:  1. Medicines called "steroids" to help your baby breathe better when he or she is born. (These are not the same as the steroids some athletes take illegally.)   2. Antibiotics to prevent an infection   Will my baby be OK? -- That depends on many factors, such as how early your baby is born, how developed his or her lungs are, and whether he or she has an infection.   Babies who are born very early are more likely to have health problems.

## 2016-04-10 ENCOUNTER — Encounter (HOSPITAL_COMMUNITY)
Admission: AD | Disposition: A | Discharge status: Home / Self Care | Payer: Self-pay | Source: Ambulatory Visit | Attending: Obstetrics and Gynecology

## 2016-04-10 ENCOUNTER — Encounter (HOSPITAL_COMMUNITY): Payer: Self-pay | Admitting: General Practice

## 2016-04-10 ENCOUNTER — Inpatient Hospital Stay (HOSPITAL_COMMUNITY): Payer: No Typology Code available for payment source | Admitting: Anesthesiology

## 2016-04-10 ENCOUNTER — Inpatient Hospital Stay (HOSPITAL_COMMUNITY)
Admission: AD | Admit: 2016-04-10 | Discharge: 2016-04-10 | DRG: 770 | Disposition: A | Discharge status: Home / Self Care | Payer: No Typology Code available for payment source | Source: Ambulatory Visit | Attending: Obstetrics and Gynecology | Admitting: Obstetrics and Gynecology

## 2016-04-10 DIAGNOSIS — Z79899 Other long term (current) drug therapy: Secondary | ICD-10-CM

## 2016-04-10 DIAGNOSIS — Z3490 Encounter for supervision of normal pregnancy, unspecified, unspecified trimester: Secondary | ICD-10-CM

## 2016-04-10 DIAGNOSIS — O034 Incomplete spontaneous abortion without complication: Secondary | ICD-10-CM | POA: Diagnosis present

## 2016-04-10 HISTORY — PX: DILATION AND EVACUATION: SHX1459

## 2016-04-10 LAB — CBC
HCT: 40 % (ref 36.0–46.0)
Hemoglobin: 14 g/dL (ref 12.0–15.0)
MCH: 28.8 pg (ref 26.0–34.0)
MCHC: 35 g/dL (ref 30.0–36.0)
MCV: 82.3 fL (ref 78.0–100.0)
PLATELETS: 247 10*3/uL (ref 150–400)
RBC: 4.86 MIL/uL (ref 3.87–5.11)
RDW: 15 % (ref 11.5–15.5)
WBC: 19.8 10*3/uL — AB (ref 4.0–10.5)

## 2016-04-10 LAB — ABO/RH: ABO/RH(D): O POS

## 2016-04-10 LAB — RPR: RPR: NONREACTIVE

## 2016-04-10 LAB — TYPE AND SCREEN
ABO/RH(D): O POS
ANTIBODY SCREEN: NEGATIVE

## 2016-04-10 SURGERY — Surgical Case
Anesthesia: Regional

## 2016-04-10 SURGERY — DILATION AND EVACUATION, UTERUS
Anesthesia: Spinal | Site: Vagina

## 2016-04-10 MED ORDER — OXYCODONE-ACETAMINOPHEN 5-325 MG PO TABS: 1.0000 | ORAL_TABLET | ORAL | 0 refills | Status: DC | PRN

## 2016-04-10 MED ORDER — FENTANYL CITRATE (PF) 100 MCG/2ML IJ SOLN
INTRAMUSCULAR | Status: DC | PRN
  Administered 2016-04-10: 50 ug via INTRAVENOUS

## 2016-04-10 MED ORDER — FLEET ENEMA 7-19 GM/118ML RE ENEM: 1.0000 | ENEMA | RECTAL | Status: DC | PRN

## 2016-04-10 MED ORDER — SODIUM CHLORIDE 0.9 % IV SOLN
3.0000 g | Freq: Once | INTRAVENOUS | Status: AC
  Administered 2016-04-10: 3 g via INTRAVENOUS
  Filled 2016-04-10: qty 3

## 2016-04-10 MED ORDER — LACTATED RINGERS IV SOLN: 500.0000 mL | INTRAVENOUS | Status: DC | PRN

## 2016-04-10 MED ORDER — FENTANYL CITRATE (PF) 100 MCG/2ML IJ SOLN
100.0000 ug | INTRAMUSCULAR | Status: DC | PRN
  Administered 2016-04-10: 100 ug via INTRAVENOUS

## 2016-04-10 MED ORDER — ONDANSETRON HCL 4 MG/2ML IJ SOLN: 4.0000 mg | Freq: Four times a day (QID) | INTRAMUSCULAR | Status: DC | PRN

## 2016-04-10 MED ORDER — MIDAZOLAM HCL 2 MG/2ML IJ SOLN
INTRAMUSCULAR | Status: AC
  Filled 2016-04-10: qty 2

## 2016-04-10 MED ORDER — LIDOCAINE HCL (CARDIAC) 20 MG/ML IV SOLN
INTRAVENOUS | Status: AC
  Filled 2016-04-10: qty 5

## 2016-04-10 MED ORDER — LIDOCAINE HCL (PF) 1 % IJ SOLN
30.0000 mL | INTRAMUSCULAR | Status: DC | PRN
Start: 2016-04-10 — End: 2016-04-10

## 2016-04-10 MED ORDER — MIDAZOLAM HCL 2 MG/2ML IJ SOLN
INTRAMUSCULAR | Status: DC | PRN
  Administered 2016-04-10 (×3): 1 mg via INTRAVENOUS

## 2016-04-10 MED ORDER — FENTANYL CITRATE (PF) 100 MCG/2ML IJ SOLN: 25.0000 ug | INTRAMUSCULAR | Status: DC | PRN

## 2016-04-10 MED ORDER — ONDANSETRON HCL 4 MG/2ML IJ SOLN
INTRAMUSCULAR | Status: DC | PRN
  Administered 2016-04-10: 4 mg via INTRAVENOUS

## 2016-04-10 MED ORDER — CHLOROPROCAINE HCL 1 % IJ SOLN
INTRAMUSCULAR | Status: AC
  Filled 2016-04-10: qty 30

## 2016-04-10 MED ORDER — ONDANSETRON HCL 4 MG/2ML IJ SOLN
INTRAMUSCULAR | Status: AC
  Filled 2016-04-10: qty 2

## 2016-04-10 MED ORDER — OXYCODONE-ACETAMINOPHEN 5-325 MG PO TABS: 2.0000 | ORAL_TABLET | ORAL | Status: DC | PRN

## 2016-04-10 MED ORDER — ACETAMINOPHEN 325 MG PO TABS: 650.0000 mg | ORAL_TABLET | ORAL | Status: DC | PRN

## 2016-04-10 MED ORDER — OXYTOCIN BOLUS FROM INFUSION: 500.0000 mL | Freq: Once | INTRAVENOUS | Status: DC

## 2016-04-10 MED ORDER — PROPOFOL 10 MG/ML IV BOLUS
INTRAVENOUS | Status: AC
  Filled 2016-04-10: qty 20

## 2016-04-10 MED ORDER — BUPIVACAINE HCL (PF) 0.25 % IJ SOLN
INTRAMUSCULAR | Status: AC
  Filled 2016-04-10: qty 10

## 2016-04-10 MED ORDER — DEXAMETHASONE SODIUM PHOSPHATE 4 MG/ML IJ SOLN
INTRAMUSCULAR | Status: AC
  Filled 2016-04-10: qty 1

## 2016-04-10 MED ORDER — OXYTOCIN 40 UNITS IN LACTATED RINGERS INFUSION - SIMPLE MED
2.5000 [IU]/h | INTRAVENOUS | Status: DC
Start: 2016-04-10 — End: 2016-04-10
  Filled 2016-04-10: qty 1000

## 2016-04-10 MED ORDER — LACTATED RINGERS IV SOLN
INTRAVENOUS | Status: DC
  Administered 2016-04-10: 125 mL/h via INTRAVENOUS

## 2016-04-10 MED ORDER — MORPHINE SULFATE-NACL 0.5-0.9 MG/ML-% IV SOSY
PREFILLED_SYRINGE | INTRAVENOUS | Status: AC
  Filled 2016-04-10: qty 1

## 2016-04-10 MED ORDER — FENTANYL CITRATE (PF) 100 MCG/2ML IJ SOLN
INTRAMUSCULAR | Status: AC
  Filled 2016-04-10: qty 2

## 2016-04-10 MED ORDER — SOD CITRATE-CITRIC ACID 500-334 MG/5ML PO SOLN
30.0000 mL | ORAL | Status: DC | PRN
  Administered 2016-04-10: 30 mL via ORAL
  Filled 2016-04-10: qty 15

## 2016-04-10 MED ORDER — OXYCODONE-ACETAMINOPHEN 5-325 MG PO TABS: 1.0000 | ORAL_TABLET | ORAL | Status: DC | PRN

## 2016-04-10 MED ORDER — SODIUM CHLORIDE 0.9 % IV SOLN
1.5000 g | Freq: Four times a day (QID) | INTRAVENOUS | Status: DC
  Filled 2016-04-10: qty 1.5

## 2016-04-10 MED ORDER — FENTANYL CITRATE (PF) 100 MCG/2ML IJ SOLN
INTRAMUSCULAR | Status: AC
  Administered 2016-04-10: 100 ug via INTRAVENOUS
  Filled 2016-04-10: qty 2

## 2016-04-10 SURGICAL SUPPLY — 19 items
CATH ROBINSON RED A/P 16FR (CATHETERS) ×3 IMPLANT
CLOTH BEACON ORANGE TIMEOUT ST (SAFETY) ×3 IMPLANT
DECANTER SPIKE VIAL GLASS SM (MISCELLANEOUS) ×3 IMPLANT
GLOVE BIO SURGEON STRL SZ7.5 (GLOVE) ×3 IMPLANT
GLOVE BIOGEL PI IND STRL 7.0 (GLOVE) ×1 IMPLANT
GLOVE BIOGEL PI INDICATOR 7.0 (GLOVE) ×2
GOWN STRL REUS W/TWL LRG LVL3 (GOWN DISPOSABLE) ×6 IMPLANT
KIT BERKELEY 1ST TRIMESTER 3/8 (MISCELLANEOUS) ×3 IMPLANT
NS IRRIG 1000ML POUR BTL (IV SOLUTION) ×3 IMPLANT
PACK VAGINAL MINOR WOMEN LF (CUSTOM PROCEDURE TRAY) ×3 IMPLANT
PAD OB MATERNITY 4.3X12.25 (PERSONAL CARE ITEMS) ×3 IMPLANT
PAD PREP 24X48 CUFFED NSTRL (MISCELLANEOUS) ×3 IMPLANT
SET BERKELEY SUCTION TUBING (SUCTIONS) ×3 IMPLANT
TOWEL OR 17X24 6PK STRL BLUE (TOWEL DISPOSABLE) ×6 IMPLANT
VACURETTE 10 RIGID CVD (CANNULA) IMPLANT
VACURETTE 12 RIGID CVD (CANNULA) ×2 IMPLANT
VACURETTE 7MM CVD STRL WRAP (CANNULA) IMPLANT
VACURETTE 8 RIGID CVD (CANNULA) IMPLANT
VACURETTE 9 RIGID CVD (CANNULA) IMPLANT

## 2016-04-10 NOTE — Transfer of Care (Signed)
Immediate Anesthesia Transfer of Care Note  Patient: [name]  Procedure(s) Performed: Procedure(s): DILATATION AND EVACUATION (N/A)  Patient Location: PACU  Anesthesia Type:Spinal  Level of Consciousness: awake, alert  and oriented  Airway & Oxygen Therapy: Patient Spontanous Breathing  Post-op Assessment: Report given to RN and Post -op Vital signs reviewed and stable  Post vital signs: Reviewed and stable  Last Vitals:  Vitals:   04/10/16 0439  BP: 122/68  Pulse: (!) 59  Resp: 18  Temp: 36.4 C    Last Pain:  Vitals:   04/10/16 0439  TempSrc: Oral  PainSc: 10-Worst pain ever         Complications: No apparent anesthesia complications

## 2016-04-10 NOTE — Op Note (Signed)
NAMESHANEKWA, MOLT                ACCOUNT NO.:  [id number]  MEDICAL RECORD NO.:  [id number]  LOCATION:  [facility]                          FACILITY:  [facility]  PHYSICIAN:  Lovenia Kim, M.D.DATE OF BIRTH:  [birth year]  DATE OF PROCEDURE: DATE OF DISCHARGE:  04/10/2016                              OPERATIVE REPORT   PREOPERATIVE DIAGNOSIS:  Retained placenta, status post 18 week spontaneous abortion.  POSTOPERATIVE DIAGNOSIS:  Retained placenta, status post 18 week spontaneous abortion.  PROCEDURE:  Suction, dilation and evacuation with removal of retained placenta.  SURGEON:  Lovenia Kim, M.D.  ASSISTANT:  None.  ANESTHESIA:  Spinal by Glennon Mac.  ESTIMATED BLOOD LOSS:  100 mL.  COMPLICATIONS:  None.  DRAINS:  None.  COUNTS:  Correct.  DISPOSITION:  The patient to recovery room in good condition.  SPECIMEN:  Placenta to pathology.  BRIEF OPERATIVE NOTE:  After being apprised with risks of anesthesia, infection, bleeding, uterine perforation, possible need for repair. Consent signed.  The patient was brought to the operating room, prepped and draped in usual sterile fashion.  Catheterized until the bladder was empty.  Exam under anesthesia reveals approximately 100 mL of clotted blood in the vagina, which is swabbed without difficulty.  Cervix was opened, grasped using a ring forceps in the anterior lip.  Placenta was expelled, predominantly intact, and with gentle traction.  However, suction revealed with 12 mm suction curette and suction cannula that there was some retained tissue, which was aspirated without difficulty.  Blunt curettage reveals cavity to be empty.  Minimal bleeding was noted.  The patient tolerated the procedure well and was awakened, transferred to recovery in good condition.     Lovenia Kim, M.D.     RJT/MEDQ  D:  04/10/2016  T:  04/10/2016  Job:  [id number]

## 2016-04-10 NOTE — Anesthesia Postprocedure Evaluation (Signed)
Anesthesia Post Note  Patient: [name]  Procedure(s) Performed: Procedure(s) (LRB): DILATATION AND EVACUATION (N/A)  Patient location during evaluation: PACU Anesthesia Type: Spinal Level of consciousness: awake Pain management: pain level controlled Vital Signs Assessment: post-procedure vital signs reviewed and stable Respiratory status: spontaneous breathing Cardiovascular status: stable Postop Assessment: no headache, no backache, spinal receding, patient able to bend at knees and no signs of nausea or vomiting Anesthetic complications: no     Last Vitals:  Vitals:   04/10/16 0800 04/10/16 0900  BP: 120/65 132/64  Pulse: 83 85  Resp:  15  Temp:  36.7 C    Last Pain:  Vitals:   04/10/16 0439  TempSrc: Oral  PainSc: 10-Worst pain ever   Pain Goal:                 [name],[name]

## 2016-04-10 NOTE — Op Note (Signed)
04/10/2016  7:14 AM  PATIENT:  [name]  35 y.o. female  PRE-OPERATIVE DIAGNOSIS:  retained placenta  POST-OPERATIVE DIAGNOSIS:  retained placenta  PROCEDURE:  Procedure(s): DILATATION AND EVACUATION  SURGEON:  Surgeon(s): Brien Few, MD  ASSISTANTS: none   ANESTHESIA:   spinal  ESTIMATED BLOOD LOSS: * No blood loss amount entered *   DRAINS: none   LOCAL MEDICATIONS USED:  NONE  SPECIMEN:  Source of Specimen:  placenta  DISPOSITION OF SPECIMEN:  PATHOLOGY  COUNTS:  YES  DICTATION #: [id number]  PLAN OF CARE: dc home  PATIENT DISPOSITION:  PACU - hemodynamically stable.

## 2016-04-10 NOTE — Discharge Instructions (Signed)
°  No heavy lifting for several days. Increase activity as tolerated.  Increase diet as tolerated. Drink enough liquids to keep urine light yellow to clear.  May have burning when urinating for the first 24hrs.   Dilation and Curettage or Vacuum Curettage, Care After Refer to this sheet in the next few weeks. These instructions provide you with information on caring for yourself after your procedure. Your health care provider may also give you more specific instructions. Your treatment has been planned according to current medical practices, but problems sometimes occur. Call your health care provider if you have any problems or questions after your procedure. WHAT TO EXPECT AFTER THE PROCEDURE After your procedure, it is typical to have light cramping and bleeding. This may last for 2 days to 2 weeks after the procedure. HOME CARE INSTRUCTIONS   Do not drive for 24 hours.  Wait 1 week before returning to strenuous activities.  Take your temperature 2 times a day for 4 days and write it down. Provide these temperatures to your health care provider if you develop a fever.  Avoid long periods of standing.  Avoid heavy lifting, pushing, or pulling. Do not lift anything heavier than 10 pounds (4.5 kg).  Limit stair climbing to once or twice a day.  Take rest periods often.  You may resume your usual diet.  Drink enough fluids to keep your urine clear or pale yellow.  Your usual bowel function should return. If you have constipation, you may:  Take a mild laxative with permission from your health care provider.  Add fruit and bran to your diet.  Drink more fluids.  Take showers instead of baths until your health care provider gives you permission to take baths.  Do not go swimming or use a hot tub until your health care provider approves.  Try to have someone with you or available to you the first 24-48 hours, especially if you were given a general anesthetic.  Do not douche,  use tampons, or have sex (intercourse) for 2 weeks after the procedure.  Only take over-the-counter or prescription medicines as directed by your health care provider. Do not take aspirin. It can cause bleeding.  Follow up with your health care provider as directed. SEEK MEDICAL CARE IF:   You have increasing cramps or pain that is not relieved with medicine.  You have abdominal pain that does not seem to be related to the same area of earlier cramping and pain.  You have bad smelling vaginal discharge.  You have a rash.  You are having problems with any medicine. SEEK IMMEDIATE MEDICAL CARE IF:   You have bleeding that is heavier than a normal menstrual period.  You have a fever.  You have chest pain.  You have shortness of breath.  You feel dizzy or feel like fainting.  You pass out.  You have pain in your shoulder strap area.  You have heavy vaginal bleeding with or without blood clots. MAKE SURE YOU:   Understand these instructions.  Will watch your condition.  Will get help right away if you are not doing well or get worse.   This information is not intended to replace advice given to you by your health care provider. Make sure you discuss any questions you have with your health care provider.   Document Released: 05/14/2000 Document Revised: 05/22/2013 Document Reviewed: 12/14/2012 Elsevier Interactive Patient Education [employer].

## 2016-04-10 NOTE — Anesthesia Procedure Notes (Signed)
Spinal  Patient location during procedure: OR Preanesthetic Checklist Completed: patient identified, site marked, surgical consent, pre-op evaluation, timeout performed, IV checked, risks and benefits discussed and monitors and equipment checked Spinal Block Patient position: sitting Prep: DuraPrep Patient monitoring: cardiac monitor, continuous pulse ox, blood pressure and heart rate Approach: midline Location: L3-4 Injection technique: catheter Needle Needle type: Tuohy and Sprotte  Needle gauge: 24 G Needle length: 12.7 cm Needle insertion depth: 7 cm Catheter type: closed end flexible Catheter size: 19 g Catheter at skin depth: 14 cm Additional Notes 2.5 cc .25% Marcaine

## 2016-04-10 NOTE — OR Nursing (Signed)
Pt received in PACU with epidural catheter in place, capped and taped. Catheter removed. Site clean and dry. Blue tip intact with catheter removed.  In recovery pt does not have any new bleeding on her pad. She has old bleeding from OR. No LDA in flowsheet for Epidural Catheter or Vagina.

## 2016-04-10 NOTE — Progress Notes (Signed)
Patient seen and examined. Consent witnessed and signed. No changes noted. Update completed. CBC    Component Value Date/Time   WBC 19.8 (H) 04/10/2016 0500   RBC 4.86 04/10/2016 0500   HGB 14.0 04/10/2016 0500   HCT 40.0 04/10/2016 0500   PLT 247 04/10/2016 0500   MCV 82.3 04/10/2016 0500   MCH 28.8 04/10/2016 0500   MCHC 35.0 04/10/2016 0500   RDW 15.0 04/10/2016 0500   LYMPHSABS 2.3 04/06/2016 2114   MONOABS 0.5 04/06/2016 2114   EOSABS 0.2 04/06/2016 2114   BASOSABS 0.0 04/06/2016 2114    Patient ID: [name], female   DOB: 1981/03/24, 35 y.o.   MRN: [id number]

## 2016-04-10 NOTE — Progress Notes (Signed)
Mementos given to FOB and pt who were very receptive. Both were very thankful that chaplain was on call for there needs. Explained that the plaster would be ready Monday and we could walk it to the entrance if they wanted to come pick it up. They seemed very thankful. Preparing POC for delivery to pathology.

## 2016-04-10 NOTE — Progress Notes (Signed)
Discussed with patient option for her and her husband to hold baby. They adamantly declined. Discussed all the memories created for patient and father. Discussed services available such as heartstrings to help pt and father cope as needed. Pt and FOB unsure how they want to deal with baby's body at this time-trying to decide.

## 2016-04-10 NOTE — H&P (Signed)
[name] is an 35 y.o. female with history of previable PPROM presents for delivery and removal of placenta  Pertinent Gynecological History: Menses: flow is moderate Bleeding: intermenstrual bleeding Contraception: none DES exposure: denies Blood transfusions: none Sexually transmitted diseases: no past history Previous GYN Procedures: na  Last mammogram: na Date: na Last pap: normal Date: 2017 OB History: G1, P0   Menstrual History: Menarche age: 59 Patient's last menstrual period was 10/30/2015.    Past Medical History:  Diagnosis Date  . Fibroids   . No pertinent past medical history   . UTI (lower urinary tract infection)     Past Surgical History:  Procedure Laterality Date  . breast cyst removed    . ROBOT ASSISTED MYOMECTOMY  07/02/2011   Procedure: ROBOTIC ASSISTED MYOMECTOMY;  Surgeon: Lovenia Kim, MD;  Location: [facility];  Service: Gynecology;  Laterality: N/A;  With Removal Of Paratubal Cyst; Cul De Sac Biopsy  . WISDOM TOOTH EXTRACTION      Family History  Problem Relation Age of Onset  . Alcohol abuse Mother   . Heart disease      grandfather  . Diabetes      grandfather  . Hypertension      grandfather  . Alcohol abuse Maternal Grandfather     Social History:  reports that she has never smoked. She has never used smokeless tobacco. She reports that she drinks alcohol. She reports that she does not use drugs.  Allergies: No Known Allergies  Prescriptions Prior to Admission  Medication Sig Dispense Refill Last Dose  . amoxicillin (AMOXIL) 500 MG capsule Take 1 capsule (500 mg total) by mouth 3 (three) times daily. 20 capsule 0 04/09/2016 at Unknown time  . Prenatal Vit-Fe Fumarate-FA (PRENATAL MULTIVITAMIN) TABS tablet Take 1 tablet by mouth daily at 12 noon.   04/10/2016 at Unknown time  . albuterol (PROVENTIL HFA;VENTOLIN HFA) 108 (90 BASE) MCG/ACT inhaler Inhale 2 puffs into the lungs every 6 (six) hours as needed for wheezing (cough,  shortness of breath or wheezing.). 1 Inhaler 1 Unknown at Unknown time  . calcium carbonate (TUMS - DOSED IN MG ELEMENTAL CALCIUM) 500 MG chewable tablet Chew 1 tablet by mouth daily.   Unknown at Unknown time    Review of Systems  Constitutional: Negative.   All other systems reviewed and are negative.   Blood pressure 122/68, pulse (!) 59, temperature 97.6 F (36.4 C), temperature source Oral, resp. rate 18, height 5\' 4"  (1.626 m), weight 98 kg (216 lb), last menstrual period 10/30/2015. Physical Exam  Nursing note and vitals reviewed. Constitutional: She is oriented to person, place, and time. She appears well-developed and well-nourished.  HENT:  Head: Normocephalic and atraumatic.  Neck: Normal range of motion. Neck supple.  Cardiovascular: Normal rate and regular rhythm.   Respiratory: Effort normal and breath sounds normal.  GI: Soft. There is tenderness.  Genitourinary: Vagina normal.     Musculoskeletal: Normal range of motion.  Neurological: She is oriented to person, place, and time.  Skin: Skin is warm.  Psychiatric: She has a normal mood and affect.    Results for orders placed or performed during the hospital encounter of 04/10/16 (from the past 24 hour(s))  CBC     Status: Abnormal   Collection Time: 04/10/16  5:00 AM  Result Value Ref Range   WBC 19.8 (H) 4.0 - 10.5 K/uL   RBC 4.86 3.87 - 5.11 MIL/uL   Hemoglobin 14.0 12.0 - 15.0 g/dL  HCT 40.0 36.0 - 46.0 %   MCV 82.3 78.0 - 100.0 fL   MCH 28.8 26.0 - 34.0 pg   MCHC 35.0 30.0 - 36.0 g/dL   RDW 15.0 11.5 - 15.5 %   Platelets 247 150 - 400 K/uL    No results found.  Assessment/Plan: SAB at 18 weeks Retained placenta To OR for placental removal Consent done  Savon Cobbs J 04/10/2016, 6:08 AM

## 2016-04-10 NOTE — Anesthesia Preprocedure Evaluation (Signed)
Anesthesia Evaluation  Patient identified by MRN, date of birth, ID band Patient awake    Reviewed: Allergy & Precautions, H&P , Patient's Chart, lab work & pertinent test results  Airway Mallampati: II  TM Distance: >3 FB Neck ROM: full    Dental no notable dental hx.    Pulmonary    Pulmonary exam normal breath sounds clear to auscultation       Cardiovascular Exercise Tolerance: Good  Rhythm:regular Rate:Normal     Neuro/Psych    GI/Hepatic   Endo/Other    Renal/GU      Musculoskeletal   Abdominal   Peds  Hematology   Anesthesia Other Findings   Reproductive/Obstetrics                             Anesthesia Physical Anesthesia Plan  ASA: III and emergent  Anesthesia Plan: Spinal   Post-op Pain Management:    Induction:   Airway Management Planned:   Additional Equipment:   Intra-op Plan:   Post-operative Plan:   Informed Consent: I have reviewed the patients History and Physical, chart, labs and discussed the procedure including the risks, benefits and alternatives for the proposed anesthesia with the patient or authorized representative who has indicated his/her understanding and acceptance.   Dental Advisory Given  Plan Discussed with: CRNA  Anesthesia Plan Comments: (Lab work confirmed with CRNA in room. Platelets okay. Discussed spinal anesthetic, and patient consents to the procedure:  included risk of possible headache,backache, failed block, allergic reaction, and nerve injury. This patient was asked if she had any questions or concerns before the procedure started. )        Anesthesia Quick Evaluation

## 2016-04-11 ENCOUNTER — Encounter (HOSPITAL_COMMUNITY): Payer: Self-pay | Admitting: Obstetrics and Gynecology

## 2016-06-10 ENCOUNTER — Encounter (HOSPITAL_COMMUNITY): Payer: Self-pay | Admitting: *Deleted

## 2016-06-10 ENCOUNTER — Other Ambulatory Visit: Payer: Self-pay | Admitting: Obstetrics and Gynecology

## 2016-06-16 NOTE — Patient Instructions (Signed)
Your procedure is scheduled on:  Thursday, Jan. 25, 2018  Enter through the [employer] [facility] at:  10:00 AM  Pick up the phone at the desk and dial [phone].  Call this number if you have problems the morning of surgery: [phone].  Remember: Do NOT eat food or drink after:  Midnight Wednesday, Jan. 24, 2018  Take these medicines the morning of surgery with a SIP OF WATER:  None  Bring Asthma Inhaler day of surgery.  Stop ALL herbal medications at this time.  Do NOT smoke the day of surgery.  Do NOT wear jewelry (body piercing), metal hair clips/bobby pins, make-up, or nail polish. Do NOT wear lotions, powders, or perfumes.  You may wear deodorant. Do NOT shave for 48 hours prior to surgery. Do NOT bring valuables to the hospital. Contacts, dentures, or bridgework may not be worn into surgery.  Have a responsible adult drive you home and stay with you for 24 hours after your procedure  Bring a copy of your healthcare power of attorney and living will documents.  **Effective Friday, Jan. 12, 2018,  will implement no hospital visitations from children age 66 and younger due to a steady increase in flu activity in our community and hospitals. **

## 2016-06-18 ENCOUNTER — Encounter (HOSPITAL_COMMUNITY)
Admission: RE | Admit: 2016-06-18 | Discharge: 2016-06-18 | Disposition: A | Discharge status: Home / Self Care | Payer: No Typology Code available for payment source | Source: Ambulatory Visit | Attending: Obstetrics and Gynecology | Admitting: Obstetrics and Gynecology

## 2016-06-18 ENCOUNTER — Encounter (HOSPITAL_COMMUNITY): Payer: Self-pay

## 2016-06-18 DIAGNOSIS — Z01812 Encounter for preprocedural laboratory examination: Secondary | ICD-10-CM | POA: Insufficient documentation

## 2016-06-18 HISTORY — DX: Hereditary deficiency of other clotting factors: D68.2

## 2016-06-18 HISTORY — DX: Tinnitus, unspecified ear: H93.19

## 2016-06-18 HISTORY — DX: Nausea with vomiting, unspecified: R11.2

## 2016-06-18 HISTORY — DX: Other specified postprocedural states: Z98.890

## 2016-06-18 LAB — CBC
HEMATOCRIT: 40.6 % (ref 36.0–46.0)
HEMOGLOBIN: 13.4 g/dL (ref 12.0–15.0)
MCH: 27.2 pg (ref 26.0–34.0)
MCHC: 33 g/dL (ref 30.0–36.0)
MCV: 82.5 fL (ref 78.0–100.0)
Platelets: 295 10*3/uL (ref 150–400)
RBC: 4.92 MIL/uL (ref 3.87–5.11)
RDW: 14.3 % (ref 11.5–15.5)
WBC: 7.8 10*3/uL (ref 4.0–10.5)

## 2016-06-23 MED ORDER — CEFAZOLIN SODIUM 10 G IJ SOLR
3.0000 g | INTRAMUSCULAR | Status: DC
  Filled 2016-06-23: qty 3000

## 2016-06-23 NOTE — H&P (Signed)
NAMELATREVA, BARTNIK                ACCOUNT NO.:  [id number]  MEDICAL RECORD NO.:  [id number]  LOCATION:                                 FACILITY:  PHYSICIAN:  Lovenia Kim, M.D.DATE OF BIRTH:  1981/04/19  DATE OF ADMISSION: DATE OF DISCHARGE:                             HISTORY & PHYSICAL   CHIEF COMPLAINT:  Abnormal uterine bleeding, history of a second- trimester pregnancy loss.  HISTORY OF PRESENT ILLNESS:  A 36 year old white female G1, P0, who presents with abnormal bleeding and questionable structural mass on saline sonohysterography.  ALLERGIES:  She has no known drug allergies.  MEDICATIONS:  Folic acid.  PAST MEDICAL HISTORY:  Medical problems to include: 1. New onset diagnosis of prothrombin gene mutation seen by     hematology. 2. She has a history of fetal demise at 18 weeks complicated by     preterm premature rupture of membranes. 3. She has a history of da Vinci myomectomy in 2013. 4. History of excision of local of breast mass, which was benign in     1997.  FAMILY HISTORY:  Diabetes, heart disease, hypertension, asthma, rheumatoid arthritis.  PHYSICAL EXAMINATION:  GENERAL:  This is a well-developed, well- nourished white female, in no acute distress. HEENT:  Normal. NECK:  Supple.  Full range of motion. LUNGS:  Clear. HEART:  Regular rate and rhythm. ABDOMEN:  Soft, nontender. PELVIC:  Reveals normal size uterus and no adnexal masses. EXTREMITIES:  There are no cords. NEUROLOGIC:  Nonfocal. SKIN:  Intact.  IMPRESSION:  Questionable structural endometrial lesion with abnormal uterine bleeding for hysteroscopy, possible removal.  PLAN:  History of diagnostic hysteroscopy and possible MyoSure.  Risks of anesthesia, infection, bleeding, injury to surrounding organs, possible need for repair discussed, delayed versus immediate complications to include bowel and bladder injury noted.  The patient acknowledges and wishes to  proceed.     Lovenia Kim, M.D.     RJT/MEDQ  D:  06/23/2016  T:  06/23/2016  Job:  [id number]  cc:   [facility]

## 2016-06-24 ENCOUNTER — Encounter (HOSPITAL_COMMUNITY): Payer: Self-pay | Admitting: Anesthesiology

## 2016-06-24 ENCOUNTER — Ambulatory Visit (HOSPITAL_COMMUNITY): Payer: No Typology Code available for payment source | Admitting: Anesthesiology

## 2016-06-24 ENCOUNTER — Ambulatory Visit (HOSPITAL_COMMUNITY)
Admission: RE | Admit: 2016-06-24 | Discharge: 2016-06-24 | Disposition: A | Discharge status: Home / Self Care | Payer: No Typology Code available for payment source | Source: Ambulatory Visit | Attending: Obstetrics and Gynecology | Admitting: Obstetrics and Gynecology

## 2016-06-24 ENCOUNTER — Encounter (HOSPITAL_COMMUNITY)
Admission: RE | Disposition: A | Discharge status: Home / Self Care | Payer: Self-pay | Source: Ambulatory Visit | Attending: Obstetrics and Gynecology

## 2016-06-24 DIAGNOSIS — Z8249 Family history of ischemic heart disease and other diseases of the circulatory system: Secondary | ICD-10-CM | POA: Insufficient documentation

## 2016-06-24 DIAGNOSIS — Z8759 Personal history of other complications of pregnancy, childbirth and the puerperium: Secondary | ICD-10-CM | POA: Diagnosis not present

## 2016-06-24 DIAGNOSIS — Z9889 Other specified postprocedural states: Secondary | ICD-10-CM | POA: Diagnosis not present

## 2016-06-24 DIAGNOSIS — Z833 Family history of diabetes mellitus: Secondary | ICD-10-CM | POA: Insufficient documentation

## 2016-06-24 DIAGNOSIS — N8500 Endometrial hyperplasia, unspecified: Secondary | ICD-10-CM | POA: Insufficient documentation

## 2016-06-24 DIAGNOSIS — N856 Intrauterine synechiae: Secondary | ICD-10-CM | POA: Insufficient documentation

## 2016-06-24 DIAGNOSIS — Z825 Family history of asthma and other chronic lower respiratory diseases: Secondary | ICD-10-CM | POA: Insufficient documentation

## 2016-06-24 DIAGNOSIS — N939 Abnormal uterine and vaginal bleeding, unspecified: Secondary | ICD-10-CM | POA: Diagnosis present

## 2016-06-24 DIAGNOSIS — R938 Abnormal findings on diagnostic imaging of other specified body structures: Secondary | ICD-10-CM | POA: Diagnosis present

## 2016-06-24 DIAGNOSIS — Z811 Family history of alcohol abuse and dependence: Secondary | ICD-10-CM | POA: Insufficient documentation

## 2016-06-24 DIAGNOSIS — D6852 Prothrombin gene mutation: Secondary | ICD-10-CM | POA: Diagnosis not present

## 2016-06-24 HISTORY — DX: Gastro-esophageal reflux disease without esophagitis: K21.9

## 2016-06-24 HISTORY — PX: DILATATION & CURETTAGE/HYSTEROSCOPY WITH MYOSURE: SHX6511

## 2016-06-24 HISTORY — DX: Other specified abnormal findings of blood chemistry: R79.89

## 2016-06-24 HISTORY — DX: Disease of blood and blood-forming organs, unspecified: D75.9

## 2016-06-24 HISTORY — DX: Anemia, unspecified: D64.9

## 2016-06-24 LAB — HCG, SERUM, QUALITATIVE: Preg, Serum: NEGATIVE

## 2016-06-24 SURGERY — DILATATION & CURETTAGE/HYSTEROSCOPY WITH MYOSURE
Anesthesia: General | Site: Vagina

## 2016-06-24 MED ORDER — SODIUM CHLORIDE 0.9 % IR SOLN
Status: DC | PRN
  Administered 2016-06-24 (×2): 3000 mL

## 2016-06-24 MED ORDER — CEFAZOLIN SODIUM-DEXTROSE 2-4 GM/100ML-% IV SOLN
2.0000 g | Freq: Once | INTRAVENOUS | Status: AC
  Administered 2016-06-24: 2 g via INTRAVENOUS

## 2016-06-24 MED ORDER — BUPIVACAINE HCL (PF) 0.25 % IJ SOLN
INTRAMUSCULAR | Status: AC
  Filled 2016-06-24: qty 30

## 2016-06-24 MED ORDER — SODIUM CHLORIDE 0.9 % IJ SOLN
INTRAMUSCULAR | Status: AC
  Filled 2016-06-24: qty 40

## 2016-06-24 MED ORDER — DEXAMETHASONE SODIUM PHOSPHATE 10 MG/ML IJ SOLN
INTRAMUSCULAR | Status: DC | PRN
  Administered 2016-06-24: 4 mg via INTRAVENOUS

## 2016-06-24 MED ORDER — KETOROLAC TROMETHAMINE 30 MG/ML IJ SOLN
INTRAMUSCULAR | Status: DC | PRN
  Administered 2016-06-24: 30 mg via INTRAVENOUS

## 2016-06-24 MED ORDER — PROMETHAZINE HCL 25 MG/ML IJ SOLN: 6.2500 mg | INTRAMUSCULAR | Status: DC | PRN

## 2016-06-24 MED ORDER — SCOPOLAMINE 1 MG/3DAYS TD PT72
MEDICATED_PATCH | TRANSDERMAL | Status: AC
  Administered 2016-06-24: 1.5 mg via TRANSDERMAL
  Filled 2016-06-24: qty 1

## 2016-06-24 MED ORDER — ONDANSETRON HCL 4 MG/2ML IJ SOLN
INTRAMUSCULAR | Status: AC
  Filled 2016-06-24: qty 2

## 2016-06-24 MED ORDER — FENTANYL CITRATE (PF) 100 MCG/2ML IJ SOLN
INTRAMUSCULAR | Status: DC | PRN
  Administered 2016-06-24 (×2): 50 ug via INTRAVENOUS

## 2016-06-24 MED ORDER — LIDOCAINE HCL (CARDIAC) 20 MG/ML IV SOLN
INTRAVENOUS | Status: AC
  Filled 2016-06-24: qty 5

## 2016-06-24 MED ORDER — DEXAMETHASONE SODIUM PHOSPHATE 4 MG/ML IJ SOLN
INTRAMUSCULAR | Status: AC
  Filled 2016-06-24: qty 1

## 2016-06-24 MED ORDER — TRAMADOL HCL 50 MG PO TABS: 50.0000 mg | ORAL_TABLET | Freq: Four times a day (QID) | ORAL | 0 refills | Status: DC | PRN

## 2016-06-24 MED ORDER — MIDAZOLAM HCL 2 MG/2ML IJ SOLN
INTRAMUSCULAR | Status: AC
  Filled 2016-06-24: qty 2

## 2016-06-24 MED ORDER — VASOPRESSIN 20 UNIT/ML IV SOLN
INTRAVENOUS | Status: AC
  Filled 2016-06-24: qty 1

## 2016-06-24 MED ORDER — BUPIVACAINE HCL (PF) 0.25 % IJ SOLN
INTRAMUSCULAR | Status: DC | PRN
  Administered 2016-06-24: 20 mL

## 2016-06-24 MED ORDER — MIDAZOLAM HCL 2 MG/2ML IJ SOLN
INTRAMUSCULAR | Status: DC | PRN
  Administered 2016-06-24: 1 mg via INTRAVENOUS

## 2016-06-24 MED ORDER — ONDANSETRON HCL 4 MG/2ML IJ SOLN
INTRAMUSCULAR | Status: DC | PRN
  Administered 2016-06-24: 4 mg via INTRAVENOUS

## 2016-06-24 MED ORDER — LIDOCAINE HCL (CARDIAC) 20 MG/ML IV SOLN
INTRAVENOUS | Status: DC | PRN
  Administered 2016-06-24: 70 mg via INTRAVENOUS
  Administered 2016-06-24: 30 mg via INTRAVENOUS

## 2016-06-24 MED ORDER — SCOPOLAMINE 1 MG/3DAYS TD PT72
1.0000 | MEDICATED_PATCH | Freq: Once | TRANSDERMAL | Status: DC
  Administered 2016-06-24: 1.5 mg via TRANSDERMAL

## 2016-06-24 MED ORDER — PROPOFOL 10 MG/ML IV BOLUS
INTRAVENOUS | Status: DC | PRN
  Administered 2016-06-24: 200 mg via INTRAVENOUS

## 2016-06-24 MED ORDER — SODIUM CHLORIDE 0.9 % IJ SOLN
INTRAMUSCULAR | Status: AC
  Filled 2016-06-24: qty 10

## 2016-06-24 MED ORDER — LACTATED RINGERS IV SOLN
INTRAVENOUS | Status: DC
  Administered 2016-06-24: 125 mL/h via INTRAVENOUS

## 2016-06-24 MED ORDER — PROPOFOL 10 MG/ML IV BOLUS
INTRAVENOUS | Status: AC
  Filled 2016-06-24: qty 20

## 2016-06-24 MED ORDER — KETOROLAC TROMETHAMINE 30 MG/ML IJ SOLN
INTRAMUSCULAR | Status: AC
Start: 2016-06-24 — End: 2016-06-24
  Filled 2016-06-24: qty 1

## 2016-06-24 MED ORDER — FENTANYL CITRATE (PF) 100 MCG/2ML IJ SOLN
INTRAMUSCULAR | Status: AC
  Filled 2016-06-24: qty 2

## 2016-06-24 MED ORDER — HYDROMORPHONE HCL 1 MG/ML IJ SOLN: 0.2500 mg | INTRAMUSCULAR | Status: DC | PRN

## 2016-06-24 SURGICAL SUPPLY — 23 items
CANISTER SUCT 3000ML (MISCELLANEOUS) ×5 IMPLANT
CATH ROBINSON RED A/P 16FR (CATHETERS) ×3 IMPLANT
CLOTH BEACON ORANGE TIMEOUT ST (SAFETY) ×3 IMPLANT
CONTAINER PREFILL 10% NBF 60ML (FORM) ×4 IMPLANT
DECANTER SPIKE VIAL GLASS SM (MISCELLANEOUS) ×4 IMPLANT
DEVICE MYOSURE LITE (MISCELLANEOUS) ×2 IMPLANT
DEVICE MYOSURE REACH (MISCELLANEOUS) IMPLANT
FILTER ARTHROSCOPY CONVERTOR (FILTER) ×3 IMPLANT
GLOVE BIO SURGEON STRL SZ7.5 (GLOVE) ×3 IMPLANT
GLOVE BIOGEL PI IND STRL 7.0 (GLOVE) ×1 IMPLANT
GLOVE BIOGEL PI INDICATOR 7.0 (GLOVE) ×2
GOWN STRL REUS W/TWL LRG LVL3 (GOWN DISPOSABLE) ×9 IMPLANT
NDL SPNL 22GX3.5 QUINCKE BK (NEEDLE) ×1 IMPLANT
NEEDLE SPNL 22GX3.5 QUINCKE BK (NEEDLE) ×3 IMPLANT
PACK VAGINAL MINOR WOMEN LF (CUSTOM PROCEDURE TRAY) ×3 IMPLANT
PAD OB MATERNITY 4.3X12.25 (PERSONAL CARE ITEMS) ×3 IMPLANT
SEAL ROD LENS SCOPE MYOSURE (ABLATOR) ×3 IMPLANT
SYR CONTROL 10ML LL (SYRINGE) ×3 IMPLANT
SYR TB 1ML 25GX5/8 (SYRINGE) ×1 IMPLANT
TOWEL OR 17X24 6PK STRL BLUE (TOWEL DISPOSABLE) ×6 IMPLANT
TUBING AQUILEX INFLOW (TUBING) ×3 IMPLANT
TUBING AQUILEX OUTFLOW (TUBING) ×3 IMPLANT
WATER STERILE IRR 1000ML POUR (IV SOLUTION) ×3 IMPLANT

## 2016-06-24 NOTE — Transfer of Care (Signed)
Immediate Anesthesia Transfer of Care Note  Patient: [name]  Procedure(s) Performed: Procedure(s): DILATATION & CURETTAGE/HYSTEROSCOPY WITH  MYOSURE (N/A)  Patient Location: PACU  Anesthesia Type:General  Level of Consciousness: awake, alert , oriented and patient cooperative  Airway & Oxygen Therapy: Patient Spontanous Breathing and Patient connected to nasal cannula oxygen  Post-op Assessment: Report given to RN and Post -op Vital signs reviewed and stable  Post vital signs: Reviewed and stable  Last Vitals:  Vitals:   06/24/16 1023  BP: (!) 146/87  Pulse: 77  Resp: 20  Temp: 36.3 C    Last Pain: There were no vitals filed for this visit.    Patients Stated Pain Goal: 3 ([id number])  Complications: No apparent anesthesia complications

## 2016-06-24 NOTE — Anesthesia Preprocedure Evaluation (Addendum)
Anesthesia Evaluation  Patient identified by MRN, date of birth, ID band Patient awake    Reviewed: Allergy & Precautions, H&P , Patient's Chart, lab work & pertinent test results  History of Anesthesia Complications (+) PONV and history of anesthetic complications  Airway Mallampati: II  TM Distance: >3 FB Neck ROM: full    Dental no notable dental hx.    Pulmonary neg pulmonary ROS, asthma ,    Pulmonary exam normal breath sounds clear to auscultation       Cardiovascular Exercise Tolerance: Good negative cardio ROS   Rhythm:regular Rate:Normal     Neuro/Psych negative neurological ROS  negative psych ROS   GI/Hepatic Neg liver ROS, GERD  ,  Endo/Other  negative endocrine ROS  Renal/GU negative Renal ROS     Musculoskeletal   Abdominal   Peds  Hematology   Anesthesia Other Findings   Reproductive/Obstetrics                             Anesthesia Physical  Anesthesia Plan  ASA: III  Anesthesia Plan: General   Post-op Pain Management:    Induction:   Airway Management Planned: LMA  Additional Equipment:   Intra-op Plan:   Post-operative Plan: Extubation in OR  Informed Consent: I have reviewed the patients History and Physical, chart, labs and discussed the procedure including the risks, benefits and alternatives for the proposed anesthesia with the patient or authorized representative who has indicated his/her understanding and acceptance.   Dental Advisory Given and Dental advisory given  Plan Discussed with: CRNA, Anesthesiologist and Surgeon  Anesthesia Plan Comments:        Anesthesia Quick Evaluation

## 2016-06-24 NOTE — Anesthesia Postprocedure Evaluation (Addendum)
Anesthesia Post Note  Patient: [name]  Procedure(s) Performed: Procedure(s) (LRB): DILATATION & CURETTAGE/HYSTEROSCOPY WITH  MYOSURE (N/A)  Patient location during evaluation: PACU Anesthesia Type: General Level of consciousness: sedated Pain management: pain level controlled Vital Signs Assessment: post-procedure vital signs reviewed and stable Respiratory status: spontaneous breathing and respiratory function stable Cardiovascular status: stable Anesthetic complications: no        Last Vitals:  Vitals:   06/24/16 1230 06/24/16 1245  BP: 113/75 114/75  Pulse: (!) 59 (!) 57  Resp: 13 13  Temp:      Last Pain: There were no vitals filed for this visit. Pain Goal: Patients Stated Pain Goal: 3 (06/24/16 1023)               [place]

## 2016-06-24 NOTE — Op Note (Signed)
06/24/2016  11:56 AM  PATIENT:  [name]  36 y.o. female  PRE-OPERATIVE DIAGNOSIS:  Abnormal Uterine Bleeding  POST-OPERATIVE DIAGNOSIS:  abnormal uterine bleeding Intrauterine synechiae  PROCEDURE:  Procedure(s): DILATATION & CURETTAGE/HYSTEROSCOPY WITH  MYOSURE RESECTION OF SYNECHIAE  SURGEON:  Surgeon(s): Brien Few, MD  ASSISTANTS: none   ANESTHESIA:   local and general  ESTIMATED BLOOD LOSS: MINIMAL, FLUID DEFICIT- 150CC  DRAINS: none   LOCAL MEDICATIONS USED:  MARCAINE    and Amount: 20 ml  SPECIMEN:  Source of Specimen:  SYNECHIAE  DISPOSITION OF SPECIMEN:  PATHOLOGY  COUNTS:  YES  DICTATION #[id number]  PLAN OF CARE: DC HOME  PATIENT DISPOSITION:  PACU - hemodynamically stable.

## 2016-06-24 NOTE — Anesthesia Procedure Notes (Signed)
Procedure Name: LMA Insertion Date/Time: 06/24/2016 11:39 AM Performed by: Tobin Chad Pre-anesthesia Checklist: Patient identified, Emergency Drugs available, Suction available and Patient being monitored Patient Re-evaluated:Patient Re-evaluated prior to inductionOxygen Delivery Method: Circle system utilized and Simple face mask Preoxygenation: Pre-oxygenation with 100% oxygen Intubation Type: IV induction and Inhalational induction Ventilation: Mask ventilation without difficulty LMA Size: 4.0 Grade View: Grade II Tube type: Oral Number of attempts: 1 Dental Injury: Teeth and Oropharynx as per pre-operative assessment

## 2016-06-24 NOTE — Anesthesia Procedure Notes (Signed)
Date/Time: 06/24/2016 11:32 AM Performed by: Tobin Chad Pre-anesthesia Checklist: Patient identified, Emergency Drugs available, Suction available and Patient being monitored Patient Re-evaluated:Patient Re-evaluated prior to inductionOxygen Delivery Method: Circle system utilized and Simple face mask Preoxygenation: Pre-oxygenation with 100% oxygen Intubation Type: IV induction and Inhalational induction Ventilation: Mask ventilation without difficulty LMA Size: 4.0 Grade View: Grade II Tube type: Oral Number of attempts: 1 Placement Confirmation: positive ETCO2 and breath sounds checked- equal and bilateral Dental Injury: Teeth and Oropharynx as per pre-operative assessment

## 2016-06-24 NOTE — Progress Notes (Signed)
Patient seen and examined. Consent witnessed and signed. No changes noted. Update completed.Patient ID: [name], female   DOB: 1981/01/19, 36 y.o.   MRN: [id number]

## 2016-06-24 NOTE — Discharge Instructions (Signed)

## 2016-06-25 ENCOUNTER — Encounter (HOSPITAL_COMMUNITY): Payer: Self-pay | Admitting: Obstetrics and Gynecology

## 2016-06-25 NOTE — Op Note (Signed)
[name], [name]                ACCOUNT NO.:  [id number]  MEDICAL RECORD NO.:  [id number]  LOCATION:                                 FACILITY:  PHYSICIAN:  Lovenia Kim, M.D.DATE OF BIRTH:  July 24, 1980  DATE OF PROCEDURE: DATE OF DISCHARGE:                              OPERATIVE REPORT   PREOPERATIVE DIAGNOSES: 1. Abnormal uterine bleeding. 2. Abnormal sonohysterogram. 3. History of second-trimester pregnancy loss.  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  ASSISTANT:  None.  ANESTHESIA:  Local general.  ESTIMATED BLOOD LOSS:  Minimal.  FLUID DEFICIT:  150 mL.  COMPLICATIONS:  None.  DRAINS:  None.  COUNTS:  Correct.  SPECIMEN:  Intrauterine synechiae to Pathology.  The patient to Recovery in good condition.  PROCEDURE:  Diagnostic hysteroscopy with MyoSure resection of intrauterine synechiae.  DESCRIPTION OF PROCEDURE:  After being apprised of risks of anesthesia, infection, bleeding, injury to surrounding organs, possible need for repair, delayed versus immediate complications to include bowel or bladder injury, possible need for repair, the patient was brought to the operating room where she was administered a general anesthetic without complications.  Prepped and draped in usual sterile fashion, catheterized until the bladder was empty.  Exam under anesthesia reveals an anteflexed uterus.  No adnexal masses.  At this time, dilute Marcaine solution placed 20 mL to standard paracervical block.  Cervix easily dilated up to a #23 Pratt dilator.  Hysteroscope placed.  Visualization reveals anterior right posterolateral intrauterine synechiae which were resected without difficulty using the MyoSure.  The bilateral tubal ostia appear normal.  There was a slight indentation at the level of the uterine fundus which appears to be consistent with possible bicornuate uterus, but no evidence of septum. Endocervical canal  appears normal.  At this time, the procedure was terminated.  Instruments removed.  Fluid deficit blood loss as noted. The patient tolerated the procedure well, was awakened and transferred to recovery in good condition.     Lovenia Kim, M.D.     RJT/MEDQ  D:  06/24/2016  T:  06/25/2016  Job:  [id number]

## 2016-07-06 ENCOUNTER — Ambulatory Visit (INDEPENDENT_AMBULATORY_CARE_PROVIDER_SITE_OTHER): Payer: No Typology Code available for payment source | Admitting: Oncology

## 2016-07-06 ENCOUNTER — Encounter: Payer: Self-pay | Admitting: Oncology

## 2016-07-06 DIAGNOSIS — D6852 Prothrombin gene mutation: Secondary | ICD-10-CM

## 2016-07-06 DIAGNOSIS — Z8249 Family history of ischemic heart disease and other diseases of the circulatory system: Secondary | ICD-10-CM

## 2016-07-06 DIAGNOSIS — Z833 Family history of diabetes mellitus: Secondary | ICD-10-CM

## 2016-07-06 DIAGNOSIS — Z811 Family history of alcohol abuse and dependence: Secondary | ICD-10-CM

## 2016-07-06 DIAGNOSIS — Z8759 Personal history of other complications of pregnancy, childbirth and the puerperium: Secondary | ICD-10-CM | POA: Diagnosis not present

## 2016-07-06 HISTORY — DX: Prothrombin gene mutation: D68.52

## 2016-07-06 NOTE — Patient Instructions (Signed)
Return visit as needed

## 2016-07-06 NOTE — Progress Notes (Signed)
New Patient Hematology   [name] [id number] 08-11-1980 36 y.o. 07/06/2016  CC: Dr. 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 She was recently pregnant for the first time. She suffered a miscarriage at 18 weeks. She underwent D and E of retained placenta on 04/10/2016. She was recently readmitted on 06/23/2016 for dysfunctional bleeding. She underwent a D and C with hysteroscopy and mild short resection of synechiae on January 25. She had a previous uterine myectomy in 2013. Laboratory testing was done in November, 2017 and she was told that she was a heterozygote for the prothrombin gene mutation. I do not have a document to confirm this at time of this dictation. There is also something that got pulled into Epic about a "factor XI mutation referred to M.D. at [facility]". There are no [facility] records in care everywhere. She has never had a blood clot. Her parents are healthy. She has a 71 year old brother who is healthy. No blood clots in the family. She has no signs or symptoms of a collagen vascular disorder. She is a never smoker and does not use alcohol.  The patient states that her mother had one miscarriage but no problems with 2 other pregnancies with her and her brother.   PMH: Past Medical History:  Diagnosis Date  . 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 Anemia   . Asthma    undiagnosed but uses inhaler as needed  . 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 Elevated ferritin level    just got referral to MD at [facility] today  . Fibroids   . GERD (gastroesophageal reflux disease)    during pregnancy  . No pertinent past medical history   . PONV (postoperative nausea and vomiting)   . Tinnitus   . UTI  (lower urinary tract infection)   No history of hypertension, ulcers, thyroid disease, hepatitis, yellow jaundice, mononucleosis, seizure or stroke, no inflammatory arthritis, no lupus.  Past Surgical History:  Procedure Laterality Date  . breast cyst removed    . DILATATION & CURETTAGE/HYSTEROSCOPY WITH MYOSURE N/A 06/24/2016   Procedure: DILATATION & CURETTAGE/HYSTEROSCOPY WITH  MYOSURE;  Surgeon: Brien Few, MD;  Location: [facility];  Service: Gynecology;  Laterality: N/A;  . DILATION AND EVACUATION N/A 04/10/2016   Procedure: DILATATION AND EVACUATION;  Surgeon: Brien Few, MD;  Location: [facility];  Service: Gynecology;  Laterality: N/A;  . ROBOT ASSISTED MYOMECTOMY  07/02/2011   Procedure: ROBOTIC ASSISTED MYOMECTOMY;  Surgeon: Lovenia Kim, MD;  Location: [facility];  Service: Gynecology;  Laterality: N/A;  With Removal Of Paratubal Cyst; Cul De Sac Biopsy  . WISDOM TOOTH EXTRACTION      Allergies: No Known Allergies  Medications:  Current Outpatient Prescriptions:  .  albuterol (PROVENTIL HFA;VENTOLIN HFA) 108 (90 BASE) MCG/ACT inhaler, Inhale 2 puffs into the lungs every 6 (six) hours as needed for wheezing (cough, shortness of breath or wheezing.)., Disp: 1 Inhaler, Rfl: 1 .  folic acid (FOLVITE) 1 MG tablet, Take 1 mg by mouth daily., Disp: , Rfl:  .  Pediatric Multivitamins-Iron (CHILDRENS CHEWABLE VITAMINS/FE PO), Take 1 tablet by mouth at bedtime., Disp: , Rfl:  .  traMADol (ULTRAM) 50 MG tablet, Take 1-2 tablets (50-100 mg total) by mouth  every 6 (six) hours as needed., Disp: 20 tablet, Rfl: 0   Social History: She is a Software engineer. Gravida 1 para 0  reports that she has never smoked. She has never used smokeless tobacco. She reports that she does not drink alcohol or use drugs.  Family History: Family History  Problem Relation Age of Onset  . Alcohol abuse Mother   . Heart disease      grandfather  . Diabetes      grandfather  . Hypertension      grandfather  .  Alcohol abuse Maternal Grandfather     Review of Systems: See HPI No frequent headaches. No dyspnea. No chest pain. No palpitations. No leg pain or swelling. Remaining ROS negative.  Physical Exam: Blood pressure 128/75, pulse 82, temperature 97.8 F (36.6 C), temperature source Oral, height 5\' 4"  (1.626 m), weight 261 lb 1.6 oz (118.4 kg), last menstrual period 06/17/2016, SpO2 98 %. Wt Readings from Last 3 Encounters:  07/06/16 261 lb 1.6 oz (118.4 kg)  06/10/16 258 lb (117 kg)  06/18/16 262 lb 2 oz (118.9 kg)     General appearance: Overweight Caucasian woman HENNT: Pharynx no erythema, exudate, mass, or ulcer. No thyromegaly or thyroid nodules Lymph nodes: No cervical, supraclavicular, or axillary lymphadenopathy Breasts: Lungs: Clear to auscultation, resonant to percussion throughout Heart: Regular rhythm, no murmur, no gallop, no rub, no click, no edema Abdomen: Soft, nontender, normal bowel sounds, no mass, no organomegaly Extremities: No edema, no calf tenderness Musculoskeletal: no joint deformities GU:  Vascular: Carotid pulses 2+  Neurologic: Alert, oriented, PERRLA, optic discs sharp and vessels normal, no hemorrhage or exudate, cranial nerves grossly normal, motor strength 5 over 5, reflexes 1+ symmetric, upper body coordination normal, gait normal, Skin: No rash or ecchymosis    Lab Results: Lab Results  Component Value Date   WBC 7.8 06/18/2016   HGB 13.4 06/18/2016   HCT 40.6 06/18/2016   MCV 82.5 06/18/2016   PLT 295 06/18/2016     Chemistry      Component Value Date/Time   NA 139 12/07/2013 1232   K 3.8 12/07/2013 1232   CL 104 12/07/2013 1232   CO2 26 12/07/2013 1232   BUN 11 12/07/2013 1232   CREATININE 0.52 12/07/2013 1232      Component Value Date/Time   CALCIUM 9.3 12/07/2013 1232   ALKPHOS 79 12/07/2013 1232   AST 13 12/07/2013 1232   ALT 13 12/07/2013 1232   BILITOT 0.3 12/07/2013 1232      Radiological Studies: No results  found.  Impression: Early second trimester pregnancy loss Incidental finding of heterozygote status for prothrombin gene mutation which I need to confirm by documentation.  At present, there is no convincing evidence that heterozygote status for either factor V Leiden or the prothrombin gene mutation is related to pregnancy loss or other pregnancy complications. There may be an increased risk with factor V Leiden gene mutations with respect to third trimester loss. There is no family history of clotting. No history of recurrent pregnancy loss in her mother.  Recommendation: When she becomes pregnant again, I do not feel that she needs antepartum prophylactic anticoagulation to maintain the pregnancy and it is arguable, in the absence of any significant family history, whether or not she needs postpartum prophylaxis.   *data now available 07/09/16: heterozygote for Prothrombin gene mutation; negative for factor V Leiden mutation, negative anticardiolipin, beta-2-GP1 antibodies, neg lupus anticoagulant. Normal protein S 88% total, 83%free, , protein C,95% total  antithrombin levels 87%. 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  Protime 9.7 seconds; lab normal 9.1-12  Misinformation in EPIC: she never went to [place]. She does not have factor XI deficiency per my discussion with patient.       Murriel Hopper, MD, [place]  Hematology-Oncology/Internal Medicine  07/06/2016, 6:05 PM

## 2016-10-30 NOTE — Addendum Note (Signed)
Addendum  created 10/30/16 0840 by Duane Boston, MD   Sign clinical note

## 2017-05-31 NOTE — L&D Delivery Note (Signed)
Operative Delivery Note At 12:03 PM a viable and healthy female was delivered via .  Presentation: vertex; Position: Left,, Occiput,, Anterior; Station: +3.  Verbal consent: obtained from patient.  Risks and benefits discussed in detail.  Risks include, but are not limited to the risks of anesthesia, bleeding, infection, damage to maternal tissues, fetal cephalhematoma.  There is also the risk of inability to effect vaginal delivery of the head, or shoulder dystocia that cannot be resolved by established maneuvers, leading to the need for emergency cesarean section.  APGAR: 8, 9; weight  pending .   Placenta status: spontaneous, intact.   Cord:  with the following complications: none.  Cord pH: na  Anesthesia:  Epidural and local Instruments: Kiwi x 3 pulls, no pop offs Episiotomy: None Lacerations: 2nd degree Suture Repair: 2.0 vicryl rapide Est. Blood Loss (mL): 300  Mom to postpartum.  Baby to Couplet care / Skin to Skin.  [name] J 01/25/2018, 1:01 PM

## 2017-06-27 LAB — OB RESULTS CONSOLE HEPATITIS B SURFACE ANTIGEN: Hepatitis B Surface Ag: NEGATIVE

## 2017-06-27 LAB — OB RESULTS CONSOLE ABO/RH: RH TYPE: POSITIVE

## 2017-06-27 LAB — OB RESULTS CONSOLE RUBELLA ANTIBODY, IGM: Rubella: IMMUNE

## 2017-06-27 LAB — OB RESULTS CONSOLE RPR: RPR: NONREACTIVE

## 2017-06-27 LAB — OB RESULTS CONSOLE ANTIBODY SCREEN: Antibody Screen: NEGATIVE

## 2017-06-27 LAB — OB RESULTS CONSOLE GC/CHLAMYDIA
Chlamydia: NEGATIVE
GC PROBE AMP, GENITAL: NEGATIVE

## 2017-06-27 LAB — OB RESULTS CONSOLE HIV ANTIBODY (ROUTINE TESTING): HIV: NONREACTIVE

## 2017-12-29 LAB — OB RESULTS CONSOLE GBS: STREP GROUP B AG: POSITIVE

## 2018-01-13 ENCOUNTER — Encounter (HOSPITAL_COMMUNITY): Payer: Self-pay | Admitting: *Deleted

## 2018-01-13 ENCOUNTER — Telehealth (HOSPITAL_COMMUNITY): Payer: Self-pay | Admitting: *Deleted

## 2018-01-13 NOTE — Telephone Encounter (Signed)
Preadmission screen  

## 2018-01-17 ENCOUNTER — Other Ambulatory Visit: Payer: Self-pay | Admitting: Obstetrics and Gynecology

## 2018-01-18 ENCOUNTER — Telehealth (HOSPITAL_COMMUNITY): Payer: Self-pay | Admitting: *Deleted

## 2018-01-18 ENCOUNTER — Encounter (HOSPITAL_COMMUNITY): Payer: Self-pay | Admitting: *Deleted

## 2018-01-18 NOTE — Telephone Encounter (Signed)
Preadmission screen  

## 2018-01-21 ENCOUNTER — Inpatient Hospital Stay (HOSPITAL_BASED_OUTPATIENT_CLINIC_OR_DEPARTMENT_OTHER): Payer: No Typology Code available for payment source

## 2018-01-21 ENCOUNTER — Inpatient Hospital Stay (HOSPITAL_COMMUNITY)
Admission: AD | Admit: 2018-01-21 | Discharge: 2018-01-21 | Disposition: A | Discharge status: Home / Self Care | Payer: No Typology Code available for payment source | Source: Ambulatory Visit | Attending: Obstetrics and Gynecology | Admitting: Obstetrics and Gynecology

## 2018-01-21 ENCOUNTER — Encounter (HOSPITAL_COMMUNITY): Payer: Self-pay | Admitting: *Deleted

## 2018-01-21 DIAGNOSIS — Z9889 Other specified postprocedural states: Secondary | ICD-10-CM | POA: Insufficient documentation

## 2018-01-21 DIAGNOSIS — Z3A38 38 weeks gestation of pregnancy: Secondary | ICD-10-CM | POA: Diagnosis not present

## 2018-01-21 DIAGNOSIS — O368130 Decreased fetal movements, third trimester, not applicable or unspecified: Secondary | ICD-10-CM | POA: Diagnosis not present

## 2018-01-21 DIAGNOSIS — Z8249 Family history of ischemic heart disease and other diseases of the circulatory system: Secondary | ICD-10-CM | POA: Insufficient documentation

## 2018-01-21 DIAGNOSIS — Z833 Family history of diabetes mellitus: Secondary | ICD-10-CM | POA: Insufficient documentation

## 2018-01-21 DIAGNOSIS — D682 Hereditary deficiency of other clotting factors: Secondary | ICD-10-CM | POA: Diagnosis not present

## 2018-01-21 DIAGNOSIS — Z811 Family history of alcohol abuse and dependence: Secondary | ICD-10-CM | POA: Insufficient documentation

## 2018-01-21 DIAGNOSIS — O99113 Other diseases of the blood and blood-forming organs and certain disorders involving the immune mechanism complicating pregnancy, third trimester: Secondary | ICD-10-CM | POA: Diagnosis not present

## 2018-01-21 DIAGNOSIS — Z79891 Long term (current) use of opiate analgesic: Secondary | ICD-10-CM | POA: Diagnosis not present

## 2018-01-21 DIAGNOSIS — O368190 Decreased fetal movements, unspecified trimester, not applicable or unspecified: Secondary | ICD-10-CM

## 2018-01-21 DIAGNOSIS — Z79899 Other long term (current) drug therapy: Secondary | ICD-10-CM | POA: Diagnosis not present

## 2018-01-21 LAB — URINALYSIS, ROUTINE W REFLEX MICROSCOPIC
BILIRUBIN URINE: NEGATIVE
Glucose, UA: NEGATIVE mg/dL
HGB URINE DIPSTICK: NEGATIVE
Ketones, ur: NEGATIVE mg/dL
NITRITE: NEGATIVE
PH: 7 (ref 5.0–8.0)
Protein, ur: NEGATIVE mg/dL
Specific Gravity, Urine: 1.002 — ABNORMAL LOW (ref 1.005–1.030)

## 2018-01-21 NOTE — MAU Provider Note (Signed)
History     CSN: [id number]  Arrival date and time: 01/21/18 1709   First Provider Initiated Contact with Patient 01/21/18 1808      Chief Complaint  Patient presents with  . Decreased Fetal Movement   HPI [name] 37 y.o. [redacted]w[redacted]d  Come to MAU today as she has not felt the baby move since last night when she felt a kick.  The baby was very active yesterday and she had a kick before going to bed.  Today she has not felt movement and came in for evaluation.  Is not feeling contractions and is not having any bleeding or leaking of fluid.  OB History    Gravida  2   Para  1   Term      Preterm      AB      Living        SAB      TAB      Ectopic      Multiple  0   Live Births              Past Medical History:  Diagnosis Date  . 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 Anemia   . Blood dyscrasia    factor 11 mutation, referred to MD at [facility]  . Elevated ferritin level    just got referral to MD at [facility] today  . Fibroid   . Fibroids   . GERD (gastroesophageal reflux disease)    during pregnancy  . No pertinent past medical history   . PONV (postoperative nausea and vomiting)   . Prothrombin gene mutation ([place]) 07/06/2016   11/17? No documentation available  . Tinnitus   . UTI (lower urinary tract infection)     Past Surgical History:  Procedure Laterality Date  . breast cyst removed    . DILATATION & CURETTAGE/HYSTEROSCOPY WITH MYOSURE N/A 06/24/2016   Procedure: DILATATION & CURETTAGE/HYSTEROSCOPY WITH  MYOSURE;  Surgeon: Brien Few, MD;  Location: [facility];  Service: Gynecology;  Laterality: N/A;  . DILATION AND EVACUATION N/A 04/10/2016   Procedure: DILATATION AND EVACUATION;  Surgeon: Brien Few, MD;  Location: [facility];  Service: Gynecology;  Laterality: N/A;  . ROBOT ASSISTED MYOMECTOMY  07/02/2011   Procedure: ROBOTIC ASSISTED MYOMECTOMY;  Surgeon: Lovenia Kim, MD;  Location: [facility];  Service: Gynecology;  Laterality:  N/A;  With Removal Of Paratubal Cyst; Cul De Sac Biopsy  . WISDOM TOOTH EXTRACTION      Family History  Problem Relation Age of Onset  . Alcohol abuse Mother   . Prostate cancer Father   . Heart disease Unknown        grandfather  . Diabetes Unknown        grandfather  . Hypertension Unknown        grandfather  . Alcohol abuse Maternal Grandfather     Social History   Tobacco Use  . Smoking status: Never Smoker  . Smokeless tobacco: Never Used  Substance Use Topics  . Alcohol use: No  . Drug use: No    Allergies: No Known Allergies  Medications Prior to Admission  Medication Sig Dispense Refill Last Dose  . folic acid (FOLVITE) 1 MG tablet TAKE 4 TABLETS BY MOUTH ONCE A DAY     . albuterol (PROVENTIL HFA;VENTOLIN HFA) 108 (90 BASE) MCG/ACT inhaler Inhale 2 puffs into the lungs every 6 (six) hours as needed for wheezing (cough, shortness of breath or wheezing.).  1 Inhaler 1 More than a month at Unknown time  . Pediatric Multivitamins-Iron (CHILDRENS CHEWABLE VITAMINS/FE PO) Take 1 tablet by mouth at bedtime.   Past Week at Unknown time  . traMADol (ULTRAM) 50 MG tablet Take 1-2 tablets (50-100 mg total) by mouth every 6 (six) hours as needed. 20 tablet 0     Review of Systems  Constitutional: Negative for fever.  Gastrointestinal: Negative for abdominal pain, diarrhea, nausea and vomiting.  Genitourinary: Negative for dysuria, vaginal bleeding and vaginal discharge.       Decreased fetal movement   Physical Exam   Blood pressure 126/66, pulse 73, temperature 97.7 F (36.5 C), temperature source Oral, resp. rate 18, weight 130.2 kg, SpO2 99 %.  Physical Exam  Nursing note and vitals reviewed. Constitutional: She is oriented to person, place, and time. She appears well-developed and well-nourished.  HENT:  Head: Normocephalic.  Eyes: EOM are normal.  Neck: Neck supple.  GI: Soft. There is no tenderness. There is no rebound and no guarding.  NST done - FHT baseline  was 135 with moderate variability.  No decelerations.  15x15 accels noted and client or examiner could not feel the baby moving.  No contractions.  Finally baby was moving enough that client could feel the movements.   Reactive NST.  Musculoskeletal: Normal range of motion.  Neurological: She is alert and oriented to person, place, and time.  Skin: Skin is warm and dry.  Psychiatric: She has a normal mood and affect.    MAU Course  Procedures Results for orders placed or performed during the hospital encounter of 01/21/18 (from the past 24 hour(s))  Urinalysis, Routine w reflex microscopic     Status: Abnormal   Collection Time: 01/21/18  5:48 PM  Result Value Ref Range   Color, Urine STRAW (A) YELLOW   APPearance CLEAR CLEAR   Specific Gravity, Urine 1.002 (L) 1.005 - 1.030   pH 7.0 5.0 - 8.0   Glucose, UA NEGATIVE NEGATIVE mg/dL   Hgb urine dipstick NEGATIVE NEGATIVE   Bilirubin Urine NEGATIVE NEGATIVE   Ketones, ur NEGATIVE NEGATIVE mg/dL   Protein, ur NEGATIVE NEGATIVE mg/dL   Nitrite NEGATIVE NEGATIVE   Leukocytes, UA SMALL (A) NEGATIVE   RBC / HPF 0-5 0 - 5 RBC/hpf   WBC, UA 6-10 0 - 5 WBC/hpf   Bacteria, UA MANY (A) NONE SEEN   Squamous Epithelial / LPF 0-5 0 - 5   Mucus PRESENT     MDM Consult with Dr. Benjie Karvonen on plan of care. Ultrasound done for BPP and AFI - BPP was 8/8 and AFI was within normal limits.  Client began feeling the baby move prior to ultrasound and movements continued after ultrasound was completed.  Client and partner felt very reassured.  Assessment and Plan  Decreased fetal movement that resolved while in MAU Reactive NST BPP 8/8 with AFI within normal limits  Plan Keep scheduled appointments. To have induction begin on Tuesday. Advised to return if the baby has any more episodes of no fetal movement similar to what she had today.  [name] 01/21/2018, 6:23 PM

## 2018-01-21 NOTE — Discharge Instructions (Signed)
Fetal Movement Counts Patient Name: ________________________________________________ Patient Due Date: ____________________ What is a fetal movement count? A fetal movement count is the number of times that you feel your baby move during a certain amount of time. This may also be called a fetal kick count. A fetal movement count is recommended for every pregnant woman. You may be asked to start counting fetal movements as early as week 28 of your pregnancy. Pay attention to when your baby is most active. You may notice your baby's sleep and wake cycles. You may also notice things that make your baby move more. You should do a fetal movement count:  When your baby is normally most active.  At the same time each day.  A good time to count movements is while you are resting, after having something to eat and drink. How do I count fetal movements? 1. Find a quiet, comfortable area. Sit, or lie down on your side. 2. Write down the date, the start time and stop time, and the number of movements that you felt between those two times. Take this information with you to your health care visits. 3. For 2 hours, count kicks, flutters, swishes, rolls, and jabs. You should feel at least 10 movements during 2 hours. 4. You may stop counting after you have felt 10 movements. 5. If you do not feel 10 movements in 2 hours, have something to eat and drink. Then, keep resting and counting for 1 hour. If you feel at least 4 movements during that hour, you may stop counting. Contact a health care provider if:  You feel fewer than 4 movements in 2 hours.  Your baby is not moving like he or she usually does. Date: ____________ Start time: ____________ Stop time: ____________ Movements: ____________ Date: ____________ Start time: ____________ Stop time: ____________ Movements: ____________ Date: ____________ Start time: ____________ Stop time: ____________ Movements: ____________ Date: ____________ Start time:  ____________ Stop time: ____________ Movements: ____________ Date: ____________ Start time: ____________ Stop time: ____________ Movements: ____________ Date: ____________ Start time: ____________ Stop time: ____________ Movements: ____________ Date: ____________ Start time: ____________ Stop time: ____________ Movements: ____________ Date: ____________ Start time: ____________ Stop time: ____________ Movements: ____________ Date: ____________ Start time: ____________ Stop time: ____________ Movements: ____________ This information is not intended to replace advice given to you by your health care provider. Make sure you discuss any questions you have with your health care provider. Document Released: 06/16/2006 Document Revised: 01/14/2016 Document Reviewed: 06/26/2015 Elsevier Interactive Patient Education  2018 [place] your appointments as scheduled. Return if the fetal movement is decreased. Drink at least 8 8-oz glasses of water every day. Do not skip meals.  Eat healthy foods.

## 2018-01-21 NOTE — MAU Note (Signed)
[name] is a 37 y.o. at [redacted]w[redacted]d here in MAU reporting:  +decreased fetal movement Felt some movement but less than normal Last normal movement felt last night Denies LOF or VB. Pain score: denies Vitals:   01/21/18 1728  BP: (!) 147/85  Pulse: 69  Resp: 18  Temp: 97.7 F (36.5 C)  SpO2: 99%     Lab orders placed from triage: ua

## 2018-01-25 ENCOUNTER — Other Ambulatory Visit: Payer: Self-pay

## 2018-01-25 ENCOUNTER — Inpatient Hospital Stay (HOSPITAL_COMMUNITY)
Admission: RE | Admit: 2018-01-25 | Discharge: 2018-01-27 | DRG: 806 | Disposition: A | Discharge status: Home / Self Care | Payer: No Typology Code available for payment source | Attending: Obstetrics and Gynecology | Admitting: Obstetrics and Gynecology

## 2018-01-25 ENCOUNTER — Inpatient Hospital Stay (HOSPITAL_COMMUNITY): Payer: No Typology Code available for payment source | Admitting: Anesthesiology

## 2018-01-25 ENCOUNTER — Encounter (HOSPITAL_COMMUNITY): Payer: Self-pay

## 2018-01-25 DIAGNOSIS — K219 Gastro-esophageal reflux disease without esophagitis: Secondary | ICD-10-CM | POA: Diagnosis present

## 2018-01-25 DIAGNOSIS — Z3A39 39 weeks gestation of pregnancy: Secondary | ICD-10-CM | POA: Diagnosis not present

## 2018-01-25 DIAGNOSIS — D649 Anemia, unspecified: Secondary | ICD-10-CM | POA: Diagnosis present

## 2018-01-25 DIAGNOSIS — O99824 Streptococcus B carrier state complicating childbirth: Secondary | ICD-10-CM | POA: Diagnosis present

## 2018-01-25 DIAGNOSIS — D6859 Other primary thrombophilia: Secondary | ICD-10-CM | POA: Diagnosis present

## 2018-01-25 DIAGNOSIS — O134 Gestational [pregnancy-induced] hypertension without significant proteinuria, complicating childbirth: Secondary | ICD-10-CM | POA: Diagnosis present

## 2018-01-25 DIAGNOSIS — O9962 Diseases of the digestive system complicating childbirth: Secondary | ICD-10-CM | POA: Diagnosis present

## 2018-01-25 DIAGNOSIS — O9902 Anemia complicating childbirth: Secondary | ICD-10-CM | POA: Diagnosis present

## 2018-01-25 DIAGNOSIS — O9912 Other diseases of the blood and blood-forming organs and certain disorders involving the immune mechanism complicating childbirth: Principal | ICD-10-CM | POA: Diagnosis present

## 2018-01-25 DIAGNOSIS — Z3490 Encounter for supervision of normal pregnancy, unspecified, unspecified trimester: Secondary | ICD-10-CM | POA: Diagnosis present

## 2018-01-25 HISTORY — DX: Encounter for supervision of normal pregnancy, unspecified, unspecified trimester: Z34.90

## 2018-01-25 LAB — COMPREHENSIVE METABOLIC PANEL
ALBUMIN: 2.8 g/dL — AB (ref 3.5–5.0)
ALK PHOS: 95 U/L (ref 38–126)
ALT: 17 U/L (ref 0–44)
ANION GAP: 11 (ref 5–15)
AST: 25 U/L (ref 15–41)
BUN: 8 mg/dL (ref 6–20)
CALCIUM: 9 mg/dL (ref 8.9–10.3)
CO2: 18 mmol/L — AB (ref 22–32)
CREATININE: 0.4 mg/dL — AB (ref 0.44–1.00)
Chloride: 107 mmol/L (ref 98–111)
GFR calc Af Amer: >60 mL/min (ref >60)
GFR calc non Af Amer: >60 mL/min (ref >60)
GLUCOSE: 132 mg/dL — AB (ref 70–99)
Potassium: 3.9 mmol/L (ref 3.5–5.1)
SODIUM: 136 mmol/L (ref 135–145)
Total Bilirubin: 0.6 mg/dL (ref 0.3–1.2)
Total Protein: 6 g/dL — ABNORMAL LOW (ref 6.5–8.1)

## 2018-01-25 LAB — COMPREHENSIVE METABOLIC PANEL WITH GFR
ALT: 16 U/L (ref 0–44)
AST: 17 U/L (ref 15–41)
Albumin: 2.8 g/dL — ABNORMAL LOW (ref 3.5–5.0)
Alkaline Phosphatase: 95 U/L (ref 38–126)
Anion gap: 10 (ref 5–15)
BUN: 11 mg/dL (ref 6–20)
CO2: 19 mmol/L — ABNORMAL LOW (ref 22–32)
Calcium: 9.3 mg/dL (ref 8.9–10.3)
Chloride: 105 mmol/L (ref 98–111)
Creatinine, Ser: 0.43 mg/dL — ABNORMAL LOW (ref 0.44–1.00)
GFR calc Af Amer: >60 mL/min
GFR calc non Af Amer: >60 mL/min
Glucose, Bld: 106 mg/dL — ABNORMAL HIGH (ref 70–99)
Potassium: 4.2 mmol/L (ref 3.5–5.1)
Sodium: 134 mmol/L — ABNORMAL LOW (ref 135–145)
Total Bilirubin: 0.6 mg/dL (ref 0.3–1.2)
Total Protein: 6.4 g/dL — ABNORMAL LOW (ref 6.5–8.1)

## 2018-01-25 LAB — CBC
HCT: 35.5 % — ABNORMAL LOW (ref 36.0–46.0)
HEMATOCRIT: 36.6 % (ref 36.0–46.0)
HEMOGLOBIN: 11.6 g/dL — AB (ref 12.0–15.0)
Hemoglobin: 12.2 g/dL (ref 12.0–15.0)
MCH: 27.6 pg (ref 26.0–34.0)
MCH: 27.1 pg (ref 26.0–34.0)
MCHC: 33.3 g/dL (ref 30.0–36.0)
MCHC: 32.7 g/dL (ref 30.0–36.0)
MCV: 82.8 fL (ref 78.0–100.0)
MCV: 82.9 fL (ref 78.0–100.0)
Platelets: 229 10*3/uL (ref 150–400)
Platelets: 246 10*3/uL (ref 150–400)
RBC: 4.42 MIL/uL (ref 3.87–5.11)
RBC: 4.28 MIL/uL (ref 3.87–5.11)
RDW: 15.1 % (ref 11.5–15.5)
RDW: 15 % (ref 11.5–15.5)
WBC: 11.6 10*3/uL — AB (ref 4.0–10.5)
WBC: 23.3 10*3/uL — AB (ref 4.0–10.5)

## 2018-01-25 LAB — TYPE AND SCREEN
ABO/RH(D): O POS
ANTIBODY SCREEN: NEGATIVE

## 2018-01-25 LAB — SYPHILIS: RPR W/REFLEX TO RPR TITER AND TREPONEMAL ANTIBODIES, TRADITIONAL SCREENING AND DIAGNOSIS ALGORITHM: RPR Ser Ql: NONREACTIVE

## 2018-01-25 MED ORDER — LIDOCAINE HCL (PF) 1 % IJ SOLN
30.0000 mL | INTRAMUSCULAR | Status: DC | PRN
  Filled 2018-01-25: qty 30

## 2018-01-25 MED ORDER — PENICILLIN G 3 MILLION UNITS IVPB - SIMPLE MED
3.0000 10*6.[IU] | INTRAVENOUS | Status: DC
  Administered 2018-01-25 (×2): 3 10*6.[IU] via INTRAVENOUS
  Filled 2018-01-25 (×3): qty 100
  Filled 2018-01-25: qty 3
  Filled 2018-01-25 (×3): qty 100
  Filled 2018-01-25: qty 3
  Filled 2018-01-25: qty 100

## 2018-01-25 MED ORDER — LABETALOL HCL 5 MG/ML IV SOLN: 20.0000 mg | INTRAVENOUS | Status: DC | PRN

## 2018-01-25 MED ORDER — DIPHENHYDRAMINE HCL 50 MG/ML IJ SOLN: 12.5000 mg | INTRAMUSCULAR | Status: DC | PRN

## 2018-01-25 MED ORDER — FENTANYL 2.5 MCG/ML BUPIVACAINE 1/10 % EPIDURAL INFUSION (WH - ANES): 14.0000 mL/h | INTRAMUSCULAR | Status: DC | PRN

## 2018-01-25 MED ORDER — ACETAMINOPHEN 325 MG PO TABS: 650.0000 mg | ORAL_TABLET | ORAL | Status: DC | PRN

## 2018-01-25 MED ORDER — PHENYLEPHRINE 40 MCG/ML (10ML) SYRINGE FOR IV PUSH (FOR BLOOD PRESSURE SUPPORT)
80.0000 ug | PREFILLED_SYRINGE | INTRAVENOUS | Status: DC | PRN
  Filled 2018-01-25: qty 5

## 2018-01-25 MED ORDER — SODIUM CHLORIDE 0.9 % IV SOLN
5.0000 10*6.[IU] | Freq: Once | INTRAVENOUS | Status: AC
  Administered 2018-01-25: 5 10*6.[IU] via INTRAVENOUS
  Filled 2018-01-25: qty 5

## 2018-01-25 MED ORDER — OXYCODONE-ACETAMINOPHEN 5-325 MG PO TABS: 2.0000 | ORAL_TABLET | ORAL | Status: DC | PRN

## 2018-01-25 MED ORDER — LACTATED RINGERS IV SOLN
INTRAVENOUS | Status: DC
  Administered 2018-01-25: 01:00:00 via INTRAVENOUS

## 2018-01-25 MED ORDER — LABETALOL HCL 5 MG/ML IV SOLN: 40.0000 mg | INTRAVENOUS | Status: DC | PRN

## 2018-01-25 MED ORDER — METHYLERGONOVINE MALEATE 0.2 MG/ML IJ SOLN: 0.2000 mg | INTRAMUSCULAR | Status: DC | PRN

## 2018-01-25 MED ORDER — SOD CITRATE-CITRIC ACID 500-334 MG/5ML PO SOLN
30.0000 mL | ORAL | Status: DC | PRN
  Filled 2018-01-25: qty 15

## 2018-01-25 MED ORDER — ONDANSETRON HCL 4 MG/2ML IJ SOLN: 4.0000 mg | Freq: Four times a day (QID) | INTRAMUSCULAR | Status: DC | PRN

## 2018-01-25 MED ORDER — ONDANSETRON HCL 4 MG/2ML IJ SOLN: 4.0000 mg | INTRAMUSCULAR | Status: DC | PRN

## 2018-01-25 MED ORDER — WITCH HAZEL-GLYCERIN EX PADS: 1.0000 "application " | MEDICATED_PAD | CUTANEOUS | Status: DC | PRN

## 2018-01-25 MED ORDER — TETANUS-DIPHTH-ACELL PERTUSSIS 5-2.5-18.5 LF-MCG/0.5 IM SUSP: 0.5000 mL | Freq: Once | INTRAMUSCULAR | Status: DC

## 2018-01-25 MED ORDER — OXYTOCIN BOLUS FROM INFUSION
500.0000 mL | Freq: Once | INTRAVENOUS | Status: AC
  Administered 2018-01-25: 500 mL via INTRAVENOUS

## 2018-01-25 MED ORDER — LACTATED RINGERS IV SOLN: 500.0000 mL | Freq: Once | INTRAVENOUS | Status: DC

## 2018-01-25 MED ORDER — IBUPROFEN 600 MG PO TABS
600.0000 mg | ORAL_TABLET | Freq: Four times a day (QID) | ORAL | Status: DC
  Administered 2018-01-25 – 2018-01-27 (×7): 600 mg via ORAL
  Filled 2018-01-25 (×7): qty 1

## 2018-01-25 MED ORDER — PHENYLEPHRINE 40 MCG/ML (10ML) SYRINGE FOR IV PUSH (FOR BLOOD PRESSURE SUPPORT)
PREFILLED_SYRINGE | INTRAVENOUS | Status: AC
  Filled 2018-01-25: qty 10

## 2018-01-25 MED ORDER — OXYTOCIN 40 UNITS IN LACTATED RINGERS INFUSION - SIMPLE MED: 2.5000 [IU]/h | INTRAVENOUS | Status: DC

## 2018-01-25 MED ORDER — SENNOSIDES-DOCUSATE SODIUM 8.6-50 MG PO TABS
2.0000 | ORAL_TABLET | ORAL | Status: DC
  Administered 2018-01-26: 2 via ORAL
  Filled 2018-01-25 (×2): qty 2

## 2018-01-25 MED ORDER — LABETALOL HCL 5 MG/ML IV SOLN: 80.0000 mg | INTRAVENOUS | Status: DC | PRN

## 2018-01-25 MED ORDER — EPHEDRINE 5 MG/ML INJ
10.0000 mg | INTRAVENOUS | Status: DC | PRN
  Filled 2018-01-25: qty 2

## 2018-01-25 MED ORDER — ZOLPIDEM TARTRATE 5 MG PO TABS: 5.0000 mg | ORAL_TABLET | Freq: Every evening | ORAL | Status: DC | PRN

## 2018-01-25 MED ORDER — BENZOCAINE-MENTHOL 20-0.5 % EX AERO
1.0000 "application " | INHALATION_SPRAY | CUTANEOUS | Status: DC | PRN
  Administered 2018-01-25: 1 via TOPICAL
  Filled 2018-01-25: qty 56

## 2018-01-25 MED ORDER — LIDOCAINE HCL (PF) 1 % IJ SOLN
INTRAMUSCULAR | Status: DC | PRN
  Administered 2018-01-25 (×2): 4 mL via EPIDURAL

## 2018-01-25 MED ORDER — LABETALOL HCL 100 MG PO TABS
100.0000 mg | ORAL_TABLET | Freq: Two times a day (BID) | ORAL | Status: DC
  Administered 2018-01-25 – 2018-01-27 (×5): 100 mg via ORAL
  Filled 2018-01-25 (×5): qty 1

## 2018-01-25 MED ORDER — SIMETHICONE 80 MG PO CHEW
80.0000 mg | CHEWABLE_TABLET | ORAL | Status: DC | PRN
  Administered 2018-01-26: 80 mg via ORAL

## 2018-01-25 MED ORDER — DIBUCAINE 1 % RE OINT: 1.0000 "application " | TOPICAL_OINTMENT | RECTAL | Status: DC | PRN

## 2018-01-25 MED ORDER — LABETALOL HCL 5 MG/ML IV SOLN
INTRAVENOUS | Status: AC
  Filled 2018-01-25: qty 4

## 2018-01-25 MED ORDER — METHYLERGONOVINE MALEATE 0.2 MG PO TABS: 0.2000 mg | ORAL_TABLET | ORAL | Status: DC | PRN

## 2018-01-25 MED ORDER — FENTANYL 2.5 MCG/ML BUPIVACAINE 1/10 % EPIDURAL INFUSION (WH - ANES)
INTRAMUSCULAR | Status: AC
  Filled 2018-01-25: qty 100

## 2018-01-25 MED ORDER — PRENATAL MULTIVITAMIN CH
1.0000 | ORAL_TABLET | Freq: Every day | ORAL | Status: DC
  Administered 2018-01-26: 1 via ORAL
  Filled 2018-01-25: qty 1

## 2018-01-25 MED ORDER — OXYCODONE-ACETAMINOPHEN 5-325 MG PO TABS
1.0000 | ORAL_TABLET | ORAL | Status: DC | PRN
  Filled 2018-01-25: qty 1

## 2018-01-25 MED ORDER — FENTANYL 2.5 MCG/ML BUPIVACAINE 1/10 % EPIDURAL INFUSION (WH - ANES)
INTRAMUSCULAR | Status: DC | PRN
  Administered 2018-01-25: 14 mL/h via EPIDURAL

## 2018-01-25 MED ORDER — COCONUT OIL OIL: 1.0000 "application " | TOPICAL_OIL | Status: DC | PRN

## 2018-01-25 MED ORDER — EPHEDRINE 5 MG/ML INJ
INTRAVENOUS | Status: AC
  Filled 2018-01-25: qty 4

## 2018-01-25 MED ORDER — DIPHENHYDRAMINE HCL 25 MG PO CAPS: 25.0000 mg | ORAL_CAPSULE | Freq: Four times a day (QID) | ORAL | Status: DC | PRN

## 2018-01-25 MED ORDER — LACTATED RINGERS IV SOLN
500.0000 mL | INTRAVENOUS | Status: DC | PRN
  Administered 2018-01-25: 500 mL via INTRAVENOUS

## 2018-01-25 MED ORDER — MISOPROSTOL 25 MCG QUARTER TABLET
25.0000 ug | ORAL_TABLET | ORAL | Status: AC | PRN
  Administered 2018-01-25: 25 ug via VAGINAL
  Filled 2018-01-25: qty 1

## 2018-01-25 MED ORDER — ONDANSETRON HCL 4 MG PO TABS: 4.0000 mg | ORAL_TABLET | ORAL | Status: DC | PRN

## 2018-01-25 MED ORDER — ENOXAPARIN SODIUM 80 MG/0.8ML ~~LOC~~ SOLN
70.0000 mg | SUBCUTANEOUS | Status: DC
  Administered 2018-01-26: 70 mg via SUBCUTANEOUS
  Filled 2018-01-25 (×2): qty 0.8

## 2018-01-25 MED ORDER — OXYTOCIN 40 UNITS IN LACTATED RINGERS INFUSION - SIMPLE MED
1.0000 m[IU]/min | INTRAVENOUS | Status: DC
  Administered 2018-01-25: 2 m[IU]/min via INTRAVENOUS
  Filled 2018-01-25: qty 1000

## 2018-01-25 MED ORDER — HYDRALAZINE HCL 20 MG/ML IJ SOLN: 10.0000 mg | INTRAMUSCULAR | Status: DC | PRN

## 2018-01-25 MED ORDER — TERBUTALINE SULFATE 1 MG/ML IJ SOLN
0.2500 mg | Freq: Once | INTRAMUSCULAR | Status: DC | PRN
  Filled 2018-01-25: qty 1

## 2018-01-25 MED ORDER — ACETAMINOPHEN 325 MG PO TABS
650.0000 mg | ORAL_TABLET | ORAL | Status: DC | PRN
  Administered 2018-01-25 – 2018-01-26 (×4): 650 mg via ORAL
  Filled 2018-01-25 (×5): qty 2

## 2018-01-25 NOTE — Progress Notes (Signed)
Called by nurse to reposition/replace IUPC catheter as moved when patient changed positions  While in room patient crying and c/o pain with contractions, during few minutes in room the patient was then calm and comfortable  Patient Vitals for the past 24 hrs:  BP Temp Temp src Pulse Resp SpO2 Height Weight  01/25/18 1024 126/69 - - 94 - - - -  01/25/18 1022 (!) 136/56 - - 92 - - - -  01/25/18 1019 (!) 123/59 - - 94 - - - -  01/25/18 1018 117/71 - - 92 - - - -  01/25/18 1015 126/68 - - 96 - 100 % - -  01/25/18 1010 (!) 103/46 - - (!) 145 - - - -  01/25/18 1007 137/80 - - 92 - - - -  01/25/18 1006 122/65 - - 89 - - - -  01/25/18 1004 128/60 - - 88 - - - -  01/25/18 1002 135/85 - - 94 - - - -  01/25/18 1000 (!) 159/65 - - (!) 101 - - - -  01/25/18 0956 (!) 179/94 - - 94 - 98 % - -  01/25/18 0954 (!) 168/89 - - 85 - - - -  01/25/18 0952 (!) 175/93 - - 85 - - - -  01/25/18 0949 129/83 - - 81 - - - -  01/25/18 0948 129/83 - - 81 - - - -  01/25/18 0946 99/63 - - 83 - - - -  01/25/18 0944 (!) 165/95 - - 83 - - - -  01/25/18 0941 (!) 188/102 - - - - - - -  01/25/18 0938 (!) 186/86 - - 84 - 98 % - -  01/25/18 0932 (!) 177/98 - - 85 - - - -  01/25/18 0926 (!) 168/80 - - 76 - - - -  01/25/18 0923 (!) 184/89 - - 76 - - - -  01/25/18 0922 (!) 184/89 - - 76 - - - -  01/25/18 0916 (!) 185/91 - - 76 - - - -  01/25/18 0915 (!) 185/91 - - 76 - - - -  01/25/18 0912 (!) 173/82 - - 76 - - - -  01/25/18 0906 (!) 181/93 - - 75 - - - -  01/25/18 0904 (!) 164/87 - - 77 - - - -  01/25/18 0901 119/80 - - 83 - - - -  01/25/18 0900 129/75 - - 86 - - - -  01/25/18 0859 129/75 - - 86 - - - -  01/25/18 0858 130/68 - - 77 - - - -  01/25/18 0856 (!) 146/69 - - 69 - - - -  01/25/18 0855 - - - - - 100 % - -  01/25/18 0854 (!) 127/57 - - 67 - - - -  01/25/18 0853 (!) 127/57 - - 67 18 100 % - -  01/25/18 0852 125/69 - - 70 - - - -  01/25/18 0850 123/64 - - 72 - - - -  01/25/18 0848 134/63 - - 70 - - - -   01/25/18 0846 (!) 147/63 - - 74 - - - -  01/25/18 0844 (!) 143/69 - - 86 - - - -  01/25/18 0842 (!) 149/41 - - 92 - - - -  01/25/18 0830 (!) 110/54 - - 100 - - - -  01/25/18 0828 (!) 105/38 - - (!) 51 - - - -  01/25/18 0726 (!) 157/91 - - 81 - - - -  01/25/18 4782 Marland Kitchen)  154/97 - - 76 16 - - -  01/25/18 0530 (!) 152/86 - - 77 16 - - -  01/25/18 0424 (!) 154/76 - - 77 16 - - -  01/25/18 0407 (!) 162/87 98 F (36.7 C) Oral 74 16 - - -  01/25/18 0309 (!) 159/82 - - 75 - - - -  01/25/18 0254 (!) 161/79 - - 75 16 - - -  01/25/18 0238 (!) 161/79 - - 77 - - - -  01/25/18 0138 (!) 157/86 - - 80 16 - - -  01/25/18 0107 - 98.1 F (36.7 C) Oral - 16 - 5\' 4"  (1.626 m) 132.3 kg  01/25/18 0046 (!) 157/86 - - 81 - - - -   A&ox3 Abd: soft, nt, gravid Cx: 8/100/0, iupc repositioned  Fht: 130s, normal variability, early decels and variables to 90s-100s with each contraction then back to baseline Toco: q 2 min  A/p: iup at 39.2 wga 1. iupc replaced and continue with amnioinfusion; good cervical change, contin plan for svd and continue close monitoring- variables and early decels appear to be resolving; bps now wnl - from 180s to 120s in few min without labetalol, contin to monitor bps closely Dr Ronita Hipps aware of above

## 2018-01-25 NOTE — Progress Notes (Signed)
Labetalol held due to current BP

## 2018-01-25 NOTE — Progress Notes (Signed)
This note also relates to the following rows which could not be included: BP - Cannot attach notes to unvalidated device data Pulse Rate - Cannot attach notes to unvalidated device data SpO2 - Cannot attach notes to unvalidated device data  Ephedrine by Anesthesiologist.

## 2018-01-25 NOTE — Anesthesia Preprocedure Evaluation (Signed)
Anesthesia Evaluation  Patient identified by MRN, date of birth, ID band Patient awake    Reviewed: Allergy & Precautions, NPO status , Patient's Chart, lab work & pertinent test results  History of Anesthesia Complications (+) PONV  Airway Mallampati: II  TM Distance: >3 FB Neck ROM: Full    Dental no notable dental hx. (+) Teeth Intact   Pulmonary    Pulmonary exam normal breath sounds clear to auscultation       Cardiovascular Normal cardiovascular exam Rhythm:Regular Rate:Normal     Neuro/Psych negative neurological ROS  negative psych ROS   GI/Hepatic Neg liver ROS,   Endo/Other  negative endocrine ROS  Renal/GU negative Renal ROS  negative genitourinary   Musculoskeletal negative musculoskeletal ROS (+)   Abdominal   Peds  Hematology  (+) anemia ,   Anesthesia Other Findings   Reproductive/Obstetrics (+) Pregnancy                             Anesthesia Physical Anesthesia Plan  ASA: II  Anesthesia Plan: Epidural   Post-op Pain Management:    Induction:   PONV Risk Score and Plan: 3 and Treatment may vary due to age or medical condition  Airway Management Planned: Natural Airway  Additional Equipment:   Intra-op Plan:   Post-operative Plan:   Informed Consent: I have reviewed the patients History and Physical, chart, labs and discussed the procedure including the risks, benefits and alternatives for the proposed anesthesia with the patient or authorized representative who has indicated his/her understanding and acceptance.     Plan Discussed with: Anesthesiologist  Anesthesia Plan Comments: (Patient identified. Risks, benefits, options discussed with patient including but not limited to bleeding, infection, nerve damage, paralysis, failed block, incomplete pain control, headache, blood pressure changes, nausea, vomiting, reactions to medication, itching, and post  partum back pain. Confirmed with bedside nurse the patient's most recent platelet count. Confirmed with the patient that they are not taking any anticoagulation, have any bleeding history or any family history of bleeding disorders. Patient expressed understanding and wishes to proceed. All questions were answered. )        Anesthesia Quick Evaluation

## 2018-01-25 NOTE — Progress Notes (Signed)
Patient ID: [name], female   DOB: 1981/03/10, 37 y.o.   MRN: [id number] Called for post epidural fetal bradycardia BP (!) 157/91   Pulse 81   Temp 98 F (36.7 C) (Oral)   Resp 16   Ht 5\' 4"  (1.626 m)   Wt 132.3 kg   BMI 50.05 kg/m   VE per RN FSE placed per RN FHR recovered into 120s with BTBV 5-25 Restart Pitocin per protocol

## 2018-01-25 NOTE — Progress Notes (Signed)
This note also relates to the following rows which could not be included: SpO2 - Cannot attach notes to unvalidated device data  Dr Murrell Redden aware of BP.  Pt has received ephedrine given by anesthesia prior to this BP elevation

## 2018-01-25 NOTE — Progress Notes (Signed)
Informed of fetal bradycardia by primary provider and asked to evaluate patient Pt comfortable on side with oxygen  Patient Vitals for the past 24 hrs:  BP Temp Temp src Pulse Resp SpO2 Height Weight  01/25/18 0906 (!) 181/93 - - 75 - - - -  01/25/18 0904 (!) 164/87 - - 77 - - - -  01/25/18 0901 119/80 - - 83 - - - -  01/25/18 0900 129/75 - - 86 - - - -  01/25/18 0859 129/75 - - 86 - - - -  01/25/18 0858 130/68 - - 77 - - - -  01/25/18 0856 (!) 146/69 - - 69 - - - -  01/25/18 0855 - - - - - 100 % - -  01/25/18 0854 (!) 127/57 - - 67 - - - -  01/25/18 0853 (!) 127/57 - - 67 18 100 % - -  01/25/18 0852 125/69 - - 70 - - - -  01/25/18 0850 123/64 - - 72 - - - -  01/25/18 0848 134/63 - - 70 - - - -  01/25/18 0846 (!) 147/63 - - 74 - - - -  01/25/18 0844 (!) 143/69 - - 86 - - - -  01/25/18 0842 (!) 149/41 - - 92 - - - -  01/25/18 0830 (!) 110/54 - - 100 - - - -  01/25/18 0828 (!) 105/38 - - (!) 51 - - - -  01/25/18 0726 (!) 157/91 - - 81 - - - -  01/25/18 0632 (!) 154/97 - - 76 16 - - -  01/25/18 0530 (!) 152/86 - - 77 16 - - -  01/25/18 0424 (!) 154/76 - - 77 16 - - -  01/25/18 0407 (!) 162/87 98 F (36.7 C) Oral 74 16 - - -  01/25/18 0309 (!) 159/82 - - 75 - - - -  01/25/18 0254 (!) 161/79 - - 75 16 - - -  01/25/18 0238 (!) 161/79 - - 77 - - - -  01/25/18 0138 (!) 157/86 - - 80 16 - - -  01/25/18 0107 - 98.1 F (36.7 C) Oral - 16 - 5\' 4"  (1.626 m) 132.3 kg  01/25/18 0046 (!) 157/86 - - 81 - - - -   A&ox3 nml respirations Abd: soft, nt, gravid, obese Cx: 3/74/-8, cephalic; iupc placed  Fht: baseline initially 150s then just after epidural, prolonged decel to 60s, lasting about 9 min then back to baseline; then subsequent decels (<47min) or variables that appear to be with contraction, these to about 100s then back to baseline; baseline now 130s-140s Toco: irreg, q 1-3 min  A/p: iup at 39.2 wga 1. Pitocin stopped and will resume pending fht 2. Fetal status - improved, but  begin amnioinfusion now and will follow closely 3. gbs positive - contin prophylaxis 4. Elevated bp - nml labs, will monitor closely to see if persist s/p treatment for hypotension  Primary provider, Dr Ronita Hipps updated on the above and aware of patient status.

## 2018-01-25 NOTE — Anesthesia Procedure Notes (Signed)
Epidural Patient location during procedure: OB Start time: 01/25/2018 8:50 AM End time: 01/25/2018 9:15 AM  Staffing Anesthesiologist: Freddrick March, MD Performed: anesthesiologist   Preanesthetic Checklist Completed: patient identified, pre-op evaluation, timeout performed, IV checked, risks and benefits discussed and monitors and equipment checked  Epidural Patient position: sitting Prep: site prepped and draped and DuraPrep Patient monitoring: continuous pulse ox, blood pressure, heart rate and cardiac monitor Approach: midline Location: L3-L4 Injection technique: LOR air  Needle:  Needle type: Tuohy  Needle gauge: 17 G Needle length: 9 cm Needle insertion depth: 8 cm Catheter type: closed end flexible Catheter size: 19 Gauge Catheter at skin depth: 13 cm Test dose: negative  Assessment Sensory level: T8 Events: blood not aspirated, injection not painful, no injection resistance, negative IV test and no paresthesia  Additional Notes Reason for block:procedure for pain

## 2018-01-25 NOTE — H&P (Signed)
[name] is a 37 y.o. female presenting for IOL due to thrombophilia. OB History    Gravida  2   Para  1   Term      Preterm      AB  0   Living        SAB      TAB      Ectopic      Multiple  0   Live Births             Past Medical History:  Diagnosis Date  . 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 Anemia   . Blood dyscrasia    factor 11 mutation, referred to MD at [facility]  . Elevated ferritin level    just got referral to MD at [facility] today  . Fibroid   . Fibroids   . GERD (gastroesophageal reflux disease)    during pregnancy  . No pertinent past medical history   . PONV (postoperative nausea and vomiting)   . Prothrombin gene mutation ([place]) 07/06/2016   11/17? No documentation available  . Tinnitus   . UTI (lower urinary tract infection)    Past Surgical History:  Procedure Laterality Date  . breast cyst removed    . DILATATION & CURETTAGE/HYSTEROSCOPY WITH MYOSURE N/A 06/24/2016   Procedure: DILATATION & CURETTAGE/HYSTEROSCOPY WITH  MYOSURE;  Surgeon: Brien Few, MD;  Location: [place] [facility];  Service: Gynecology;  Laterality: N/A;  . DILATION AND EVACUATION N/A 04/10/2016   Procedure: DILATATION AND EVACUATION;  Surgeon: Brien Few, MD;  Location: [facility];  Service: Gynecology;  Laterality: N/A;  . ROBOT ASSISTED MYOMECTOMY  07/02/2011   Procedure: ROBOTIC ASSISTED MYOMECTOMY;  Surgeon: Lovenia Kim, MD;  Location: [facility];  Service: Gynecology;  Laterality: N/A;  With Removal Of Paratubal Cyst; Cul De Sac Biopsy  . WISDOM TOOTH EXTRACTION     Family History: family history includes Alcohol abuse in her maternal grandfather and mother; Diabetes in her unknown relative; Heart disease in her unknown relative; Hypertension in her unknown relative; Prostate cancer in her father. Social History:  reports that she has never smoked. She has never used smokeless tobacco. She reports that she does not drink alcohol or use drugs.      Maternal Diabetes: No Genetic Screening: Normal Maternal Ultrasounds/Referrals: Normal Fetal Ultrasounds or other Referrals:  None Maternal Substance Abuse:  No Significant Maternal Medications:  None Significant Maternal Lab Results:  None Other Comments:  None  Review of Systems  Constitutional: Negative.   All other systems reviewed and are negative.  Maternal Medical History:  Reason for admission: Contractions.   Contractions: Frequency: rare.   Perceived severity is mild.    Fetal activity: Perceived fetal activity is normal.   Last perceived fetal movement was within the past hour.    Prenatal complications: Thrombophilia.   Prenatal Complications - Diabetes: none.    Dilation: 1 Effacement (%): Thick Station: Ballotable Exam by:: Anwaynu, MD Blood pressure (!) 152/86, pulse 77, temperature 98 F (36.7 C), temperature source Oral, resp. rate 16, height 5\' 4"  (1.626 m), weight 132.3 kg. Maternal Exam:  Uterine Assessment: Contraction strength is mild.  Contraction frequency is rare.   Abdomen: Patient reports no abdominal tenderness. Fetal presentation: vertex  Introitus: Normal vulva. Normal vagina.  Ferning test: negative.  Nitrazine test: negative. Amniotic fluid character: not assessed.  Pelvis: adequate for delivery.   Cervix: Cervix evaluated by digital exam.     Physical Exam  Nursing note and vitals reviewed. Constitutional: She is oriented to person, place, and time. She appears well-developed and well-nourished.  HENT:  Head: Normocephalic and atraumatic.  Neck: Normal range of motion. Neck supple.  Cardiovascular: Normal rate and regular rhythm.  Respiratory: Effort normal and breath sounds normal.  GI: Soft. Bowel sounds are normal.  Genitourinary: Vagina normal and uterus normal.  Musculoskeletal: Normal range of motion.  Neurological: She is alert and oriented to person, place, and time. She has normal reflexes.  Skin: Skin is warm and  dry.  Psychiatric: She has a normal mood and affect.    Prenatal labs: ABO, Rh: --/--/O POS (08/28 0118) Antibody: NEG (08/28 0118) Rubella: Immune (01/28 0000) RPR: Nonreactive (01/28 0000)  HBsAg: Negative (01/28 0000)  HIV: Non-reactive (01/28 0000)  GBS: Positive (08/01 0000)   Assessment/Plan: 39 wk IUP Thrombophilia New onset HTN- nl labs Monitor BP closely IOL   Koichi Platte J 01/25/2018, 6:31 AM

## 2018-01-25 NOTE — Lactation Note (Signed)
This note was copied from a baby's chart. Lactation Consultation Note  Patient Name: [name] [id number] Date: 01/25/2018 Reason for consult: Initial assessment;Primapara;1st time breastfeeding;Term  P1 mother whose infant is now 73 hours old.  RN requested latch assistance.  Mother is concerned that baby has not latched well yet.  I reassured her this is appropriate feeding behavior for a 64 hour old infant.  He is not showing any feeding cues but offered to assist and mother accepted.  Mother's breasts are large and non tender and nipples are short shafted bilaterally.  Assisted baby to latch in the football hold on the right breast.  He was able to latch but did not suck when at the breast.  He remained sleepy and showed no interest in feeding.  I suggested mother continue to do STS and watch for feeding cues.  Also provided breast shells and a manual pump with instructions for use to help evert nipples.  Mother will begin wearing the breast shells in the a.m.  Demonstrated the pump and the #24 flange size is appropriate at this time.  Reviewed pump assembly, disassembly and cleaning.  Colostrum container provided for hand expression.  Mother will continue hand expression before/after feedings and feed back any EBM she obtains.    Mother will call for latch assistance as needed.  RN updated.   Maternal Data Formula Feeding for Exclusion: No Has patient been taught Hand Expression?: Yes Does the patient have breastfeeding experience prior to this delivery?: No  Feeding Feeding Type: Breast Fed Length of feed: 0 min  LATCH Score Latch: Too sleepy or reluctant, no latch achieved, no sucking elicited.  Audible Swallowing: None  Type of Nipple: Everted at rest and after stimulation(short shafted bilaterally)  Comfort (Breast/Nipple): Soft / non-tender  Hold (Positioning): Assistance needed to correctly position infant at breast and maintain latch.  LATCH Score:  5  Interventions Interventions: Breast feeding basics reviewed;Assisted with latch;Skin to skin;Breast massage;Hand express;Pre-pump if needed;Position options;Support pillows;Adjust position;Breast compression;Shells;Hand pump  Lactation Tools Discussed/Used Tools: Shells;Pump Shell Type: Inverted Breast pump type: Manual Pump Review: Setup, frequency, and cleaning;Milk Storage Initiated by:: Paul Dykes Date initiated:: 01/25/18   Consult Status Consult Status: Follow-up Date: 01/26/18 Follow-up type: In-patient    [name] 01/25/2018, 10:28 PM

## 2018-01-26 LAB — CBC
HCT: 30.1 % — ABNORMAL LOW (ref 36.0–46.0)
Hemoglobin: 10 g/dL — ABNORMAL LOW (ref 12.0–15.0)
MCH: 27.8 pg (ref 26.0–34.0)
MCHC: 33.2 g/dL (ref 30.0–36.0)
MCV: 83.6 fL (ref 78.0–100.0)
Platelets: 212 10*3/uL (ref 150–400)
RBC: 3.6 MIL/uL — ABNORMAL LOW (ref 3.87–5.11)
RDW: 15.2 % (ref 11.5–15.5)
WBC: 14.1 10*3/uL — ABNORMAL HIGH (ref 4.0–10.5)

## 2018-01-26 LAB — CREATININE, SERUM
CREATININE: 0.48 mg/dL (ref 0.44–1.00)
GFR calc Af Amer: >60 mL/min (ref >60)

## 2018-01-26 NOTE — Anesthesia Postprocedure Evaluation (Signed)
Anesthesia Post Note  Patient: [name]  Procedure(s) Performed: AN AD HOC LABOR EPIDURAL     Anesthesia Post Evaluation  Last Vitals:  Vitals:   01/26/18 0615 01/26/18 0954  BP: 130/76 140/71  Pulse: 81 81  Resp: 20   Temp: 36.6 C   SpO2: 98%     Last Pain:  Vitals:   01/26/18 0947  TempSrc:   PainSc: 4    Pain Goal:                 Aymen Widrig L Wenda Vanschaick

## 2018-01-26 NOTE — Progress Notes (Signed)
Post Partum Day 1 Subjective: no complaints, up ad lib, voiding, tolerating PO and + flatus  Objective: Blood pressure 140/71, pulse 81, temperature 97.8 F (36.6 C), temperature source Axillary, resp. rate 20, height 5\' 4"  (1.626 m), weight 132.3 kg, SpO2 98 %, unknown if currently breastfeeding.  Physical Exam:  General: alert, cooperative and appears stated age Lochia: appropriate Uterine Fundus: firm Incision: na DVT Evaluation: No evidence of DVT seen on physical exam. No cords or calf tenderness.  Recent Labs    01/25/18 1331 01/26/18 0532  HGB 11.6* 10.0*  HCT 35.5* 30.1*    Assessment/Plan: Stable PPD 1 History of thrombophilia Start Lovenox today Plan for discharge tomorrow, Breastfeeding and Circumcision prior to discharge   LOS: 1 day   Seydou Hearns J 01/26/2018, 10:25 AM

## 2018-01-26 NOTE — Lactation Note (Signed)
This note was copied from a baby's chart. Lactation Consultation Note  Patient Name: [name] Date: 01/26/2018 Reason for consult: Follow-up assessment;Difficult latch;Term [name] is 32 hours old and has been very sleepy at breast.  [name] was circumcised today and currently sleeping in crib.  Mom has done hand expression and manual pump but no colostrum seen.  Instructed to call for latch assist when baby starts to show signs of hunger.  Symphony pump set up and initiated.  Instructed to pump every 3 hours x 15 minutes and give back any colostrum obtained by cup or spoon.    Maternal Data    Feeding    LATCH Score                   Interventions    Lactation Tools Discussed/Used Pump Review: Setup, frequency, and cleaning;Milk Storage Initiated by:: LM Date initiated:: 01/26/18   Consult Status Consult Status: Follow-up Date: 01/27/18 Follow-up type: In-patient    Ave Filter 01/26/2018, 2:26 PM

## 2018-01-26 NOTE — Progress Notes (Signed)
Phenylephrine give by Dr Lanetta Inch, anesthesiologist IV push

## 2018-01-26 NOTE — Anesthesia Postprocedure Evaluation (Addendum)
Anesthesia Post Note  Patient: [name]  Procedure(s) Performed: AN AD HOC LABOR EPIDURAL     Patient location during evaluation: Mother Baby Anesthesia Type: Epidural Level of consciousness: awake, awake and alert and oriented Pain management: pain level controlled Vital Signs Assessment: post-procedure vital signs reviewed and stable Respiratory status: spontaneous breathing, nonlabored ventilation and respiratory function stable Cardiovascular status: stable Postop Assessment: no headache, no backache, able to ambulate, no apparent nausea or vomiting and patient able to bend at knees Anesthetic complications: no  BP 641/58 at 0745  Last Vitals:  Vitals:   01/26/18 0615 01/26/18 0954  BP: 130/76 140/71  Pulse: 81 81  Resp: 20   Temp: 36.6 C   SpO2: 98%     Last Pain:  Vitals:   01/26/18 0947  TempSrc:   PainSc: 4    Pain Goal:                 Myer Bohlman

## 2018-01-27 ENCOUNTER — Ambulatory Visit: Payer: Self-pay

## 2018-01-27 MED ORDER — ENOXAPARIN SODIUM 80 MG/0.8ML ~~LOC~~ SOLN: 70.0000 mg | SUBCUTANEOUS | 0 refills | Status: DC

## 2018-01-27 MED ORDER — IBUPROFEN 600 MG PO TABS: 600.0000 mg | ORAL_TABLET | Freq: Four times a day (QID) | ORAL | 0 refills | Status: DC

## 2018-01-27 MED ORDER — LABETALOL HCL 100 MG PO TABS: 100.0000 mg | ORAL_TABLET | Freq: Two times a day (BID) | ORAL | 0 refills | Status: DC

## 2018-01-27 NOTE — Lactation Note (Signed)
This note was copied from a baby's chart. Lactation Consultation Note Mom stating baby isn't satisfied at the breast. Mom has large breast, dependent edema. Mom has shells and bra but hasn't wore them. Reverse pressure to soften areola. Slightly helpful not enough for latch.  Fitted #20 NS. Has room could wear #16. Baby is chomping so mom states #20 is comfortable.  Baby has very high palate, noted upper gum has notch in center. Mom stated she had a gap between her teeth when she was young.  5 fr. Tubing w/Similac 19 cal. Started. Baby tolerated well. Instructed FOB and mom how to use. And clean.  Stressed importance of wearing shells, pre-pumping and post pumping to give colostrum for supplemental feeding. Noted colostrum in NS.  Stressed to mom until edema less and baby can feed w/o NS mom will need to feed w/it. Patient Name: [name] [id number] Date: 01/27/2018 Reason for consult: Difficult latch;Mother's request;1st time breastfeeding   Maternal Data    Feeding Feeding Type: Formula Length of feed: 25 min  LATCH Score Latch: Repeated attempts needed to sustain latch, nipple held in mouth throughout feeding, stimulation needed to elicit sucking reflex.  Audible Swallowing: A few with stimulation  Type of Nipple: Everted at rest and after stimulation(short shaft)  Comfort (Breast/Nipple): Soft / non-tender  Hold (Positioning): Full assist, staff holds infant at breast  LATCH Score: 6  Interventions Interventions: Breast feeding basics reviewed;Support pillows;Assisted with latch;Position options;Skin to skin;Breast massage;Hand express;Shells;Pre-pump if needed;Reverse pressure;Hand pump;DEBP;Breast compression;Adjust position  Lactation Tools Discussed/Used Tools: Shells;Pump;Nipple Shields;43F feeding tube / Syringe(not wearing shells) Nipple shield size: 20;16 Shell Type: Inverted Breast pump type: Double-Electric Breast Pump WIC Program: No   Consult  Status Consult Status: Follow-up Date: 01/27/18 Follow-up type: In-patient    [name], Elta Guadeloupe 01/27/2018, 3:41 AM

## 2018-01-27 NOTE — Discharge Summary (Signed)
Obstetric Discharge Summary  Patient ID: TAQUITA DEMBY MRN: [id number] DOB/AGE: 1980/11/08 37 y.o.   Date of Admission: 01/25/2018  Date of Discharge:  01/27/18  Admitting Diagnosis: Induction of labor at [redacted]w[redacted]d  Secondary Diagnosis: Thrombophilia, Gestational Hypertension, Advanced Maternal Age  Mode of Delivery: vacuum-assisted vaginal delivery     Discharge Diagnosis: No other diagnosis   Intrapartum Procedures: Atificial rupture of membranes, epidural, GBS prophylaxis, pitocin augmentation and Cytotec   Post partum procedures: None  Complications: Second degree perineal laceration   Brief Hospital Course   Anzley SHALINA NORFOLK is a G2P1001 who had a vacuum assist vaginal delivery on 01/25/2018;  for further details of this birth, please refer to the delivey note. Her postpartum course was complicated by gestational hypertension requiring treatment with labetalol. By time of discharge on PPD#2, her pain and blood pressure were controlled on oral medications; she had appropriate lochia and was ambulating, voiding without difficulty and tolerating regular diet.  She was deemed stable for discharge to home.    Labs:  CBC Latest Ref Rng & Units 01/26/2018 01/25/2018 01/25/2018  WBC 4.0 - 10.5 K/uL 14.1(H) 23.3(H) 11.6(H)  Hemoglobin 12.0 - 15.0 g/dL 10.0(L) 11.6(L) 12.2  Hematocrit 36.0 - 46.0 % 30.1(L) 35.5(L) 36.6  Platelets 150 - 400 K/uL 212 246 229   O POS  Physical exam:   Temp:  [98 F (36.7 C)-98.2 F (36.8 C)] 98.2 F (36.8 C) (08/30 0610) Pulse Rate:  [79-84] 84 (08/30 0610) Resp:  [20] 20 (08/30 0610) BP: (125-141)/(67-74) 125/74 (08/30 0610) SpO2:  [98 %-99 %] 98 % (08/30 0610)  General: alert and no distress  Lochia: appropriate  Abdomen: soft, NT  Uterine Fundus: firm  Perineum: healing well, no significant drainage, no dehiscence, no significant erythema  Extremities: No evidence of DVT seen on physical exam  Discharge Instructions: Per After Visit  Summary.  Activity: Advance as tolerated. Pelvic rest for 6 weeks.  Also refer to After Visit Summary  Diet: Regular  Medications: Allergies as of 01/27/2018   No Known Allergies     Medication List    TAKE these medications   aspirin 81 MG chewable tablet Chew by mouth daily.   calcium carbonate 750 MG chewable tablet Commonly known as:  TUMS EX Chew 1 tablet by mouth daily.   enoxaparin 80 MG/0.8ML injection Commonly known as:  LOVENOX Inject 0.7 mLs (70 mg total) into the skin daily.   folic acid 1 MG tablet Commonly known as:  FOLVITE TAKE 4 TABLETS BY MOUTH ONCE A DAY   ibuprofen 600 MG tablet Commonly known as:  ADVIL,MOTRIN Take 1 tablet (600 mg total) by mouth every 6 (six) hours.   labetalol 100 MG tablet Commonly known as:  NORMODYNE Take 1 tablet (100 mg total) by mouth 2 (two) times daily.   prenatal multivitamin Tabs tablet Take 1 tablet by mouth daily at 12 noon.      Outpatient follow up:  Follow-up Information    Obgyn, Chief Operating Officer. Call in 1 week(s).   Why:  Please call the office to schedule a one (1) week blood pressure check.  Contact information: [address] [place] [address] [phone]        Brien Few, MD Follow up in 6 week(s).   Specialty:  Obstetrics and Gynecology Why:  The office will call you to schedule a six (6) week postpartum visit with Dr. Laney Potash information: [address] [place] [address] [phone]  Postpartum contraception: abstinence  Discharged Condition: stable  Discharged to: home   Newborn Data:  Disposition:home with mother  Apgars: APGAR (1 MIN): 8   APGAR (5 MINS): 9    Baby Feeding: Breast   Diona Fanti, [facility] 01/27/18 9:13 AM

## 2018-01-27 NOTE — Discharge Instructions (Signed)
Breastfeeding Challenges and Solutions Even though breastfeeding is natural, it can be challenging, especially in the first few weeks after childbirth. It is normal for problems to arise when starting to breastfeed your new baby, even if you have breastfed before. This document provides some solutions to the most common breastfeeding challenges. Challenges and solutions Challenge--Cracked or Sore Nipples Cracked or sore nipples are commonly experienced by breastfeeding mothers. Cracked or sore nipples often are caused by inadequate latching (when your baby's mouth attaches to your breast to breastfeed). Soreness can also happen if your baby is not positioned properly at your breast. Although nipple cracking and soreness are common during the first week after birth, nipple pain is never normal. If you experience nipple cracking or soreness that lasts longer than 1 week or nipple pain, call your health care provider or lactation consultant. Solution Ensure proper latching and positioning of your baby by following the steps below:  Find a comfortable place to sit or lie down, with your neck and back well supported.  Place a pillow or rolled up blanket under your baby to bring him or her to the level of your breast (if you are seated).  Make sure that your baby's abdomen is facing your abdomen.  Gently massage your breast. With your fingertips, massage from your chest wall toward your nipple in a circular motion. This encourages milk flow. You may need to continue this action during the feeding if your milk flows slowly.  Support your breast with 4 fingers underneath and your thumb above your nipple. Make sure your fingers are well away from your nipple and your babys mouth.  Stroke your baby's lips gently with your finger or nipple.  When your baby's mouth is open wide enough, quickly bring your baby to your breast, placing your entire nipple and as much of the colored area around your nipple  (areola) as possible into your baby's mouth. ? More areola should be visible above your baby's upper lip than below the lower lip. ? Your baby's tongue should be between his or her lower gum and your breast.  Ensure that your baby's mouth is correctly positioned around your nipple (latched). Your baby's lips should create a seal on your breast and be turned out (everted).  It is common for your baby to suck for about 2-3 minutes in order to start the flow of breast milk.  Signs that your baby has successfully latched on to your nipple include:  Quietly tugging or quietly sucking without causing you pain.  Swallowing heard between every 3-4 sucks.  Muscle movement above and in front of his or her ears with sucking.  Signs that your baby has not successfully latched on to nipple include:  Sucking sounds or smacking sounds from your baby while nursing.  Nipple pain.  Ensure that your breasts stay moisturized and healthy by:  Avoiding the use of soap on your nipples.  Wearing a supportive bra. Avoid wearing underwire-style bras or tight bras.  Air drying your nipples for 3-4 minutes after each feeding.  Using only cotton bra pads to absorb breast milk leakage. Leaking of breast milk between feedings is normal. Be sure to change the pads if they become soaked with milk.  Using lanolin on your nipples after nursing. Lanolin helps to maintain your skin's normal moisture barrier. If you use pure lanolin you do not need to wash it off before feeding your baby again. Pure lanolin is not toxic to your baby. You may also  hand express a few drops of breast milk and gently massage that milk into your nipples, allowing it to air dry.  Challenge--Breast Engorgement Breast engorgement is the overfilling of your breasts with breast milk. In the first few weeks after giving birth, you may experience breast engorgement. Breast engorgement can make your breasts throb and feel hard, tightly stretched,  warm, and tender. Engorgement peaks about the fifth day after you give birth. Having breast engorgement does not mean you have to stop breastfeeding your baby. Solution  Breastfeed when you feel the need to reduce the fullness of your breasts or when your baby shows signs of hunger. This is called "breastfeeding on demand."  Newborns (babies younger than 4 weeks) often breastfeed every 1-3 hours during the day. You may need to awaken your baby to feed if he or she is asleep at a feeding time.  Do not allow your baby to sleep longer than 5 hours during the night without a feeding.  Pump or hand express breast milk before breastfeeding to soften your breast, areola, and nipple.  Apply warm, moist heat (in the shower or with warm water-soaked hand towels) just before feeding or pumping, or massage your breast before or during breastfeeding. This increases circulation and helps your milk to flow.  Completely empty your breasts when breastfeeding or pumping. Afterward, wear a snug bra (nursing or regular) or tank top for 1-2 days to signal your body to slightly decrease milk production. Only wear snug bras or tank tops to treat engorgement. Tight bras typically should be avoided by breastfeeding mothers. Once engorgement is relieved, return to wearing regular, loose-fitting clothes.  Apply ice packs to your breasts to lessen the pain from engorgement and relieve swelling, unless the ice is uncomfortable for you.  Do not delay feedings. Try to relax when it is time to feed your baby. This helps to trigger your "let-down reflex," which releases milk from your breast.  Ensure your baby is latched on to your breast and positioned properly while breastfeeding.  Allow your baby to remain at your breast as long as he or she is latched on well and actively sucking. Your baby will let you know when he or she is done breastfeeding by pulling away from your breast or falling asleep.  Avoid introducing bottles  or pacifiers to your baby in the early weeks of breastfeeding. Wait to introduce these things until after resolving any breastfeeding challenges.  Try to pump your milk on the same schedule as when your baby would breastfeed if you are returning to work or away from home for an extended period.  Drink plenty of fluids to avoid dehydration, which can eventually put you at greater risk of breast engorgement.  If you follow these suggestions, your engorgement should improve in 24-48 hours. If you are still experiencing difficulty, call your lactation consultant or health care provider. Challenge--Plugged Milk Ducts Plugged milk ducts occur when the duct does not drain milk effectively and becomes swollen. Wearing a tight-fitting nursing bra or having difficulty with latching may cause plugged milk ducts. Not drinking enough water (8-10 c [1.9-2.4 L] per day) can contribute to plugged milk ducts. Once a duct has become plugged, hard lumps, soreness, and redness may develop in your breast. Solution Do not delay feedings. Feed your baby frequently and try to empty your breasts of milk at each feeding. Try breastfeeding from the affected side first so there is a better chance that the milk will drain completely from  that breast. Apply warm, moist towels to your breasts for 5-10 minutes before feeding. Alternatively, a hot shower right before breastfeeding can provide the moist heat that can encourage milk flow. Gentle massage of the sore area before and during a feeding may also help. Avoid wearing tight clothing or bras that put pressure on your breasts. Wear bras that offer good support to your breasts, but avoid underwire bras. If you have a plugged milk duct and develop a fever, you need to see your health care provider. Challenge--Mastitis Mastitis is inflammation of your breast. It usually is caused by a bacterial infection and can cause flu-like symptoms. You may develop redness in your breast and a  fever. Often when mastitis occurs, your breast becomes firm, warm, and very painful. The most common causes of mastitis are poor latching, ineffective sucking from your baby, consistent pressure on your breast (possibly from wearing a tight-fitting bra or shirt that restricts the milk flow), unusual stress or fatigue, or missed feedings. Solution You will be given antibiotic medicine to treat the infection. It is still important to breastfeed frequently to empty your breasts. Continuing to breastfeed while you recover from mastitis will not harm your baby. Make sure your baby is positioned properly during every feeding. Apply moist heat to your breasts for a few minutes before feeding to help the milk flow and to help your breasts empty more easily. Challenge--Thrush Ritta Slot is a yeast infection that can form on your nipples, in your breast, or in your baby's mouth. It causes itching, soreness, burning or stabbing pain, and sometimes a rash. Solution You will be given a medicated ointment for your nipples, and your baby will be given a liquid medicine for his or her mouth. It is important that you and your baby are treated at the same time because thrush can be passed between you and your baby. Change disposable nursing pads often. Any bras, towels, or clothing that come in contact with infected areas of your body or your baby's body need to be washed in very hot water every day. Wash your hands and your baby's hands often. All pacifiers, bottle nipples, or toys your baby puts in his or her mouth should be boiled once a day for 20 minutes. After 1 week of treatment, discard pacifiers and bottle nipples and buy new ones. All breast pump parts that touch the milk need to be boiled for 20 minutes every day. Challenge--Low Milk Supply You may not be producing enough milk if your baby is not gaining the proper amount of weight. Breast milk production is based on a supply-and-demand system. Your milk supply depends  on how frequently and effectively your baby empties your breast. Solution The more you breastfeed and pump, the more breast milk you will produce. It is important that your baby empties at least one of your breasts at each feeding. If this is not happening, then use a breast pump or hand express any milk that remains. This will help to drain as much milk as possible at each feeding. It will also signal your body to produce more milk. If your baby is not emptying your breasts, it may be due to latching, sucking, or positioning problems. If low milk supply continues after addressing these issues, contact your health care provider or a lactation specialist as soon as possible. Challenge--Inverted or Flat Nipples Some women have nipples that turn inward instead of protruding outward. Other women have nipples that are flat. Inverted or flat nipples can  sometimes make it more difficult for your baby to latch onto your breast. Solution You may be given a small device that pulls out inverted nipples. This device should be applied right before your baby is brought to your breast. You can also try using a breast pump for a short time before placing the baby at your breast. The pump can pull your nipple outwards to help your infant latch more easily. The baby's sucking motion will help the inverted nipple protrude as well. If you have flat nipples, encourage your baby to latch onto your breast and feed frequently in the early days after birth. This will give your baby practice latching on correctly while your breast is still soft. When your milk supply increases, between the second and fifth day after birth and your breasts become full, your baby will have an easier time latching. Contact a lactation consultant if you still have concerns. She or he can teach you additional techniques to address breastfeeding problems related to nipple shape and position. Where to find more information: [employer]:  www.llli.org This information is not intended to replace advice given to you by your health care provider. Make sure you discuss any questions you have with your health care provider. Document Released: 11/08/2005 Document Revised: 10/29/2015 Document Reviewed: 11/10/2012 Elsevier Interactive Patient Education  2017 [employer]. Breastfeeding Choosing to breastfeed is one of the best decisions you can make for yourself and your baby. A change in hormones during pregnancy causes your breasts to make breast milk in your milk-producing glands. Hormones prevent breast milk from being released before your baby is born. They also prompt milk flow after birth. Once breastfeeding has begun, thoughts of your baby, as well as his or her sucking or crying, can stimulate the release of milk from your milk-producing glands. Benefits of breastfeeding Research shows that breastfeeding offers many health benefits for infants and mothers. It also offers a cost-free and convenient way to feed your baby. For your baby  Your first milk (colostrum) helps your baby's digestive system to function better.  Special cells in your milk (antibodies) help your baby to fight off infections.  Breastfed babies are less likely to develop asthma, allergies, obesity, or type 2 diabetes. They are also at lower risk for sudden infant death syndrome (SIDS).  Nutrients in breast milk are better able to meet your babys needs compared to infant formula.  Breast milk improves your baby's brain development. For you  Breastfeeding helps to create a very special bond between you and your baby.  Breastfeeding is convenient. Breast milk costs nothing and is always available at the correct temperature.  Breastfeeding helps to burn calories. It helps you to lose the weight that you gained during pregnancy.  Breastfeeding makes your uterus return faster to its size before pregnancy. It also slows bleeding (lochia) after you give  birth.  Breastfeeding helps to lower your risk of developing type 2 diabetes, osteoporosis, rheumatoid arthritis, cardiovascular disease, and breast, ovarian, uterine, and endometrial cancer later in life. Breastfeeding basics Starting breastfeeding  Find a comfortable place to sit or lie down, with your neck and back well-supported.  Place a pillow or a rolled-up blanket under your baby to bring him or her to the level of your breast (if you are seated). Nursing pillows are specially designed to help support your arms and your baby while you breastfeed.  Make sure that your baby's tummy (abdomen) is facing your abdomen.  Gently massage your breast.  With your fingertips, massage from the outer edges of your breast inward toward the nipple. This encourages milk flow. If your milk flows slowly, you may need to continue this action during the feeding.  Support your breast with 4 fingers underneath and your thumb above your nipple (make the letter "C" with your hand). Make sure your fingers are well away from your nipple and your babys mouth.  Stroke your baby's lips gently with your finger or nipple.  When your baby's mouth is open wide enough, quickly bring your baby to your breast, placing your entire nipple and as much of the areola as possible into your baby's mouth. The areola is the colored area around your nipple. ? More areola should be visible above your baby's upper lip than below the lower lip. ? Your baby's lips should be opened and extended outward (flanged) to ensure an adequate, comfortable latch. ? Your baby's tongue should be between his or her lower gum and your breast.  Make sure that your baby's mouth is correctly positioned around your nipple (latched). Your baby's lips should create a seal on your breast and be turned out (everted).  It is common for your baby to suck about 2-3 minutes in order to start the flow of breast milk. Latching Teaching your baby how to latch  onto your breast properly is very important. An improper latch can cause nipple pain, decreased milk supply, and poor weight gain in your baby. Also, if your baby is not latched onto your nipple properly, he or she may swallow some air during feeding. This can make your baby fussy. Burping your baby when you switch breasts during the feeding can help to get rid of the air. However, teaching your baby to latch on properly is still the best way to prevent fussiness from swallowing air while breastfeeding. Signs that your baby has successfully latched onto your nipple  Silent tugging or silent sucking, without causing you pain. Infant's lips should be extended outward (flanged).  Swallowing heard between every 3-4 sucks once your milk has started to flow (after your let-down milk reflex occurs).  Muscle movement above and in front of his or her ears while sucking.  Signs that your baby has not successfully latched onto your nipple  Sucking sounds or smacking sounds from your baby while breastfeeding.  Nipple pain.  If you think your baby has not latched on correctly, slip your finger into the corner of your babys mouth to break the suction and place it between your baby's gums. Attempt to start breastfeeding again. Signs of successful breastfeeding Signs from your baby  Your baby will gradually decrease the number of sucks or will completely stop sucking.  Your baby will fall asleep.  Your baby's body will relax.  Your baby will retain a small amount of milk in his or her mouth.  Your baby will let go of your breast by himself or herself.  Signs from you  Breasts that have increased in firmness, weight, and size 1-3 hours after feeding.  Breasts that are softer immediately after breastfeeding.  Increased milk volume, as well as a change in milk consistency and color by the fifth day of breastfeeding.  Nipples that are not sore, cracked, or bleeding.  Signs that your baby is  getting enough milk  Wetting at least 1-2 diapers during the first 24 hours after birth.  Wetting at least 5-6 diapers every 24 hours for the first week after birth. The urine should be  clear or pale yellow by the age of 5 days.  Wetting 6-8 diapers every 24 hours as your baby continues to grow and develop.  At least 3 stools in a 24-hour period by the age of 5 days. The stool should be soft and yellow.  At least 3 stools in a 24-hour period by the age of 7 days. The stool should be seedy and yellow.  No loss of weight greater than 10% of birth weight during the first 3 days of life.  Average weight gain of 4-7 oz (113-198 g) per week after the age of 4 days.  Consistent daily weight gain by the age of 5 days, without weight loss after the age of 2 weeks. After a feeding, your baby may spit up a small amount of milk. This is normal. Breastfeeding frequency and duration Frequent feeding will help you make more milk and can prevent sore nipples and extremely full breasts (breast engorgement). Breastfeed when you feel the need to reduce the fullness of your breasts or when your baby shows signs of hunger. This is called "breastfeeding on demand." Signs that your baby is hungry include:  Increased alertness, activity, or restlessness.  Movement of the head from side to side.  Opening of the mouth when the corner of the mouth or cheek is stroked (rooting).  Increased sucking sounds, smacking lips, cooing, sighing, or squeaking.  Hand-to-mouth movements and sucking on fingers or hands.  Fussing or crying.  Avoid introducing a pacifier to your baby in the first 4-6 weeks after your baby is born. After this time, you may choose to use a pacifier. Research has shown that pacifier use during the first year of a baby's life decreases the risk of sudden infant death syndrome (SIDS). Allow your baby to feed on each breast as long as he or she wants. When your baby unlatches or falls asleep while  feeding from the first breast, offer the second breast. Because newborns are often sleepy in the first few weeks of life, you may need to awaken your baby to get him or her to feed. Breastfeeding times will vary from baby to baby. However, the following rules can serve as a guide to help you make sure that your baby is properly fed:  Newborns (babies 36 weeks of age or younger) may breastfeed every 1-3 hours.  Newborns should not go without breastfeeding for longer than 3 hours during the day or 5 hours during the night.  You should breastfeed your baby a minimum of 8 times in a 24-hour period.  Breast milk pumping Pumping and storing breast milk allows you to make sure that your baby is exclusively fed your breast milk, even at times when you are unable to breastfeed. This is especially important if you go back to work while you are still breastfeeding, or if you are not able to be present during feedings. Your lactation consultant can help you find a method of pumping that works best for you and give you guidelines about how long it is safe to store breast milk. Caring for your breasts while you breastfeed Nipples can become dry, cracked, and sore while breastfeeding. The following recommendations can help keep your breasts moisturized and healthy:  Avoid using soap on your nipples.  Wear a supportive bra designed especially for nursing. Avoid wearing underwire-style bras or extremely tight bras (sports bras).  Air-dry your nipples for 3-4 minutes after each feeding.  Use only cotton bra pads to absorb leaked breast  milk. Leaking of breast milk between feedings is normal.  Use lanolin on your nipples after breastfeeding. Lanolin helps to maintain your skin's normal moisture barrier. Pure lanolin is not harmful (not toxic) to your baby. You may also hand express a few drops of breast milk and gently massage that milk into your nipples and allow the milk to air-dry.  In the first few weeks  after giving birth, some women experience breast engorgement. Engorgement can make your breasts feel heavy, warm, and tender to the touch. Engorgement peaks within 3-5 days after you give birth. The following recommendations can help to ease engorgement:  Completely empty your breasts while breastfeeding or pumping. You may want to start by applying warm, moist heat (in the shower or with warm, water-soaked hand towels) just before feeding or pumping. This increases circulation and helps the milk flow. If your baby does not completely empty your breasts while breastfeeding, pump any extra milk after he or she is finished.  Apply ice packs to your breasts immediately after breastfeeding or pumping, unless this is too uncomfortable for you. To do this: ? Put ice in a plastic bag. ? Place a towel between your skin and the bag. ? Leave the ice on for 20 minutes, 2-3 times a day.  Make sure that your baby is latched on and positioned properly while breastfeeding.  If engorgement persists after 48 hours of following these recommendations, contact your health care provider or a Science writer. Overall health care recommendations while breastfeeding  Eat 3 healthy meals and 3 snacks every day. Well-nourished mothers who are breastfeeding need an additional 450-500 calories a day. You can meet this requirement by increasing the amount of a balanced diet that you eat.  Drink enough water to keep your urine pale yellow or clear.  Rest often, relax, and continue to take your prenatal vitamins to prevent fatigue, stress, and low vitamin and mineral levels in your body (nutrient deficiencies).  Do not use any products that contain nicotine or tobacco, such as cigarettes and e-cigarettes. Your baby may be harmed by chemicals from cigarettes that pass into breast milk and exposure to secondhand smoke. If you need help quitting, ask your health care provider.  Avoid alcohol.  Do not use illegal drugs or  marijuana.  Talk with your health care provider before taking any medicines. These include over-the-counter and prescription medicines as well as vitamins and herbal supplements. Some medicines that may be harmful to your baby can pass through breast milk.  It is possible to become pregnant while breastfeeding. If birth control is desired, ask your health care provider about options that will be safe while breastfeeding your baby. Where to find more information: [employer]: www.llli.org Contact a health care provider if:  You feel like you want to stop breastfeeding or have become frustrated with breastfeeding.  Your nipples are cracked or bleeding.  Your breasts are red, tender, or warm.  You have: ? Painful breasts or nipples. ? A swollen area on either breast. ? A fever or chills. ? Nausea or vomiting. ? Drainage other than breast milk from your nipples.  Your breasts do not become full before feedings by the fifth day after you give birth.  You feel sad and depressed.  Your baby is: ? Too sleepy to eat well. ? Having trouble sleeping. ? More than 37 week old and wetting fewer than 6 diapers in a 24-hour period. ? Not gaining weight by 43 days of age.  Your baby has fewer than 3 stools in a 24-hour period.  Your baby's skin or the white parts of his or her eyes become yellow. Get help right away if:  Your baby is overly tired (lethargic) and does not want to wake up and feed.  Your baby develops an unexplained fever. Summary  Breastfeeding offers many health benefits for infant and mothers.  Try to breastfeed your infant when he or she shows early signs of hunger.  Gently tickle or stroke your baby's lips with your finger or nipple to allow the baby to open his or her mouth. Bring the baby to your breast. Make sure that much of the areola is in your baby's mouth. Offer one side and burp the baby before you offer the other side.  Talk with your  health care provider or lactation consultant if you have questions or you face problems as you breastfeed. This information is not intended to replace advice given to you by your health care provider. Make sure you discuss any questions you have with your health care provider. Document Released: 05/17/2005 Document Revised: 06/18/2016 Document Reviewed: 06/18/2016 Elsevier Interactive Patient Education  2018 [place] Instructions for Mom ACTIVITY  Gradually return to your regular activities.  Let yourself rest. Nap while your baby sleeps.  Avoid lifting anything that is heavier than 10 lb (4.5 kg) until your health care provider says it is okay.  Avoid activities that take a lot of effort and energy (are strenuous) until approved by your health care provider. Walking at a slow-to-moderate pace is usually safe.  If you had a cesarean delivery: ? Do not vacuum, climb stairs, or drive a car for 4-6 weeks. ? Have someone help you at home until you feel like you can do your usual activities yourself. ? Do exercises as told by your health care provider, if this applies.  VAGINAL BLEEDING You may continue to bleed for 4-6 weeks after delivery. Over time, the amount of blood usually decreases and the color of the blood usually gets lighter. However, the flow of bright red blood may increase if you have been too active. If you need to use more than one pad in an hour because your pad gets soaked, or if you pass a large clot:  Lie down.  Raise your feet.  Place a cold compress on your lower abdomen.  Rest.  Call your health care provider.  If you are breastfeeding, your period should return anytime between 8 weeks after delivery and the time that you stop breastfeeding. If you are not breastfeeding, your period should return 6-8 weeks after delivery. PERINEAL CARE The perineal area, or perineum, is the part of your body between your thighs. After delivery, this area needs  special care. Follow these instructions as told by your health care provider.  Take warm tub baths for 15-20 minutes.  Use medicated pads and pain-relieving sprays and creams as told.  Do not use tampons or douches until vaginal bleeding has stopped.  Each time you go to the bathroom: ? Use a peri bottle. ? Change your pad. ? Use towelettes in place of toilet paper until your stitches have healed.  Do Kegel exercises every day. Kegel exercises help to maintain the muscles that support the vagina, bladder, and bowels. You can do these exercises while you are standing, sitting, or lying down. To do Kegel exercises: ? Tighten the muscles of your abdomen and the muscles that surround your birth canal. ?  Hold for a few seconds. ? Relax. ? Repeat until you have done this 5 times in a row.  To prevent hemorrhoids from developing or getting worse: ? Drink enough fluid to keep your urine clear or pale yellow. ? Avoid straining when having a bowel movement. ? Take over-the-counter medicines and stool softeners as told by your health care provider.  BREAST CARE  Wear a tight-fitting bra.  Avoid taking over-the-counter pain medicine for breast discomfort.  Apply ice to the breasts to help with discomfort as needed: ? Put ice in a plastic bag. ? Place a towel between your skin and the bag. ? Leave the ice on for 20 minutes or as told by your health care provider.  NUTRITION  Eat a well-balanced diet.  Do not try to lose weight quickly by cutting back on calories.  Take your prenatal vitamins until your postpartum checkup or until your health care provider tells you to stop.  POSTPARTUM DEPRESSION You may find yourself crying for no apparent reason and unable to cope with all of the changes that come with having a newborn. This mood is called postpartum depression. Postpartum depression happens because your hormone levels change after delivery. If you have postpartum depression, get  support from your partner, friends, and family. If the depression does not go away on its own after several weeks, contact your health care provider. BREAST SELF-EXAM Do a breast self-exam each month, at the same time of the month. If you are breastfeeding, check your breasts just after a feeding, when your breasts are less full. If you are breastfeeding and your period has started, check your breasts on day 5, 6, or 7 of your period. Report any lumps, bumps, or discharge to your health care provider. Know that breasts are normally lumpy if you are breastfeeding. This is temporary, and it is not a health risk. INTIMACY AND SEXUALITY Avoid sexual activity for at least 3-4 weeks after delivery or until the brownish-red vaginal flow is completely gone. If you want to avoid pregnancy, use some form of birth control. You can get pregnant after delivery, even if you have not had your period. SEEK MEDICAL CARE IF:  You feel unable to cope with the changes that a child brings to your life, and these feelings do not go away after several weeks.  You notice a lump, a bump, or discharge on your breast.  SEEK IMMEDIATE MEDICAL CARE IF:  Blood soaks your pad in 1 hour or less.  You have: ? Severe pain or cramping in your lower abdomen. ? A bad-smelling vaginal discharge. ? A fever that is not controlled by medicine. ? A fever, and an area of your breast is red and sore. ? Pain or redness in your calf. ? Sudden, severe chest pain. ? Shortness of breath. ? Painful or bloody urination. ? Problems with your vision.  You vomit for 12 hours or longer.  You develop a severe headache.  You have serious thoughts about hurting yourself, your child, or anyone else.  This information is not intended to replace advice given to you by your health care provider. Make sure you discuss any questions you have with your health care provider. Document Released: 05/14/2000 Document Revised: 10/23/2015 Document  Reviewed: 11/18/2014 Elsevier Interactive Patient Education  2017 [place]. Postpartum Care After Vaginal Delivery The period of time right after you deliver your newborn is called the postpartum period. What kind of medical care will I receive?  You may continue  to receive fluids and medicines through an IV tube inserted into one of your veins.  If an incision was made near your vagina (episiotomy) or if you had some vaginal tearing during delivery, cold compresses may be placed on your episiotomy or your tear. This helps to reduce pain and swelling.  You may be given a squirt bottle to use when you go to the bathroom. You may use this until you are comfortable wiping as usual. To use the squirt bottle, follow these steps: ? Before you urinate, fill the squirt bottle with warm water. Do not use hot water. ? After you urinate, while you are sitting on the toilet, use the squirt bottle to rinse the area around your urethra and vaginal opening. This rinses away any urine and blood. ? You may do this instead of wiping. As you start healing, you may use the squirt bottle before wiping yourself. Make sure to wipe gently. ? Fill the squirt bottle with clean water every time you use the bathroom.  You will be given sanitary pads to wear. How can I expect to feel?  You may not feel the need to urinate for several hours after delivery.  You will have some soreness and pain in your abdomen and vagina.  If you are breastfeeding, you may have uterine contractions every time you breastfeed for up to several weeks postpartum. Uterine contractions help your uterus return to its normal size.  It is normal to have vaginal bleeding (lochia) after delivery. The amount and appearance of lochia is often similar to a menstrual period in the first week after delivery. It will gradually decrease over the next few weeks to a dry, yellow-brown discharge. For most women, lochia stops completely by 6-8 weeks after  delivery. Vaginal bleeding can vary from woman to woman.  Within the first few days after delivery, you may have breast engorgement. This is when your breasts feel heavy, full, and uncomfortable. Your breasts may also throb and feel hard, tightly stretched, warm, and tender. After this occurs, you may have milk leaking from your breasts.Your health care provider can help you relieve discomfort due to breast engorgement. Breast engorgement should go away within a few days.  You may feel more sad or worried than normal due to hormonal changes after delivery. These feelings should not last more than a few days. If these feelings do not go away after several days, speak with your health care provider. How should I care for myself?  Tell your health care provider if you have pain or discomfort.  Drink enough water to keep your urine clear or pale yellow.  Wash your hands thoroughly with soap and water for at least 20 seconds after changing your sanitary pads, after using the toilet, and before holding or feeding your baby.  If you are not breastfeeding, avoid touching your breasts a lot. Doing this can make your breasts produce more milk.  If you become weak or lightheaded, or you feel like you might faint, ask for help before: ? Getting out of bed. ? Showering.  Change your sanitary pads frequently. Watch for any changes in your flow, such as a sudden increase in volume, a change in color, the passing of large blood clots. If you pass a blood clot from your vagina, save it to show to your health care provider. Do not flush blood clots down the toilet without having your health care provider look at them.  Make sure that all your vaccinations are  up to date. This can help protect you and your baby from getting certain diseases. You may need to have immunizations done before you leave the hospital.  If desired, talk with your health care provider about methods of family planning or birth control  (contraception). How can I start bonding with my baby? Spending as much time as possible with your baby is very important. During this time, you and your baby can get to know each other and develop a bond. Having your baby stay with you in your room (rooming in) can give you time to get to know your baby. Rooming in can also help you become comfortable caring for your baby. Breastfeeding can also help you bond with your baby. How can I plan for returning home with my baby?  Make sure that you have a car seat installed in your vehicle. ? Your car seat should be checked by a certified car seat installer to make sure that it is installed safely. ? Make sure that your baby fits into the car seat safely.  Ask your health care provider any questions you have about caring for yourself or your baby. Make sure that you are able to contact your health care provider with any questions after leaving the hospital. This information is not intended to replace advice given to you by your health care provider. Make sure you discuss any questions you have with your health care provider. Document Released: 03/14/2007 Document Revised: 10/20/2015 Document Reviewed: 04/21/2015 Elsevier Interactive Patient Education  2018 [employer]. Postpartum Depression and Baby Blues The postpartum period begins right after the birth of a baby. During this time, there is often a great amount of joy and excitement. It is also a time of many changes in the life of the parents. Regardless of how many times a mother gives birth, each child brings new challenges and dynamics to the family. It is not unusual to have feelings of excitement along with confusing shifts in moods, emotions, and thoughts. All mothers are at risk of developing postpartum depression or the "baby blues." These mood changes can occur right after giving birth, or they may occur many months after giving birth. The baby blues or postpartum depression can be mild or  severe. Additionally, postpartum depression can go away rather quickly, or it can be a long-term condition. What are the causes? Raised hormone levels and the rapid drop in those levels are thought to be a main cause of postpartum depression and the baby blues. A number of hormones change during and after pregnancy. Estrogen and progesterone usually decrease right after the delivery of your baby. The levels of thyroid hormone and various cortisol steroids also rapidly drop. Other factors that play a role in these mood changes include major life events and genetics. What increases the risk? If you have any of the following risks for the baby blues or postpartum depression, know what symptoms to watch out for during the postpartum period. Risk factors that may increase the likelihood of getting the baby blues or postpartum depression include:  Having a personal or family history of depression.  Having depression while being pregnant.  Having premenstrual mood issues or mood issues related to oral contraceptives.  Having a lot of life stress.  Having marital conflict.  Lacking a social support network.  Having a baby with special needs.  Having health problems, such as diabetes.  What are the signs or symptoms? Symptoms of baby blues include:  Brief changes in mood, such  as going from extreme happiness to sadness.  Decreased concentration.  Difficulty sleeping.  Crying spells, tearfulness.  Irritability.  Anxiety.  Symptoms of postpartum depression typically begin within the first month after giving birth. These symptoms include:  Difficulty sleeping or excessive sleepiness.  Marked weight loss.  Agitation.  Feelings of worthlessness.  Lack of interest in activity or food.  Postpartum psychosis is a very serious condition and can be dangerous. Fortunately, it is rare. Displaying any of the following symptoms is cause for immediate medical attention. Symptoms of  postpartum psychosis include:  Hallucinations and delusions.  Bizarre or disorganized behavior.  Confusion or disorientation.  How is this diagnosed? A diagnosis is made by an evaluation of your symptoms. There are no medical or lab tests that lead to a diagnosis, but there are various questionnaires that a health care provider may use to identify those with the baby blues, postpartum depression, or psychosis. Often, a screening tool called the [place] Postnatal Depression Scale is used to diagnose depression in the postpartum period. How is this treated? The baby blues usually goes away on its own in 1-2 weeks. Social support is often all that is needed. You will be encouraged to get adequate sleep and rest. Occasionally, you may be given medicines to help you sleep. Postpartum depression requires treatment because it can last several months or longer if it is not treated. Treatment may include individual or group therapy, medicine, or both to address any social, physiological, and psychological factors that may play a role in the depression. Regular exercise, a healthy diet, rest, and social support may also be strongly recommended. Postpartum psychosis is more serious and needs treatment right away. Hospitalization is often needed. Follow these instructions at home:  Get as much rest as you can. Nap when the baby sleeps.  Exercise regularly. Some women find yoga and walking to be beneficial.  Eat a balanced and nourishing diet.  Do little things that you enjoy. Have a cup of tea, take a bubble bath, read your favorite magazine, or listen to your favorite music.  Avoid alcohol.  Ask for help with household chores, cooking, grocery shopping, or running errands as needed. Do not try to do everything.  Talk to people close to you about how you are feeling. Get support from your partner, family members, friends, or other new moms.  Try to stay positive in how you think. Think about the  things you are grateful for.  Do not spend a lot of time alone.  Only take over-the-counter or prescription medicine as directed by your health care provider.  Keep all your postpartum appointments.  Let your health care provider know if you have any concerns. Contact a health care provider if: You are having a reaction to or problems with your medicine. Get help right away if:  You have suicidal feelings.  You think you may harm the baby or someone else. This information is not intended to replace advice given to you by your health care provider. Make sure you discuss any questions you have with your health care provider. Document Released: 02/19/2004 Document Revised: 10/23/2015 Document Reviewed: 02/26/2013 Elsevier Interactive Patient Education  2017 [place]. How and Where to Give Subcutaneous Enoxaparin Injections Enoxaparin is an injectable medicine. It is used to help prevent blood clots from developing in your veins. Health care providers often use anticoagulants like enoxaparin to prevent clots following surgery. Enoxaparin is also used in combination with other medicines to treat blood clots and  heart attacks. If blood clots are left untreated, they can be life threatening. Enoxaparin comes in single-use syringes. You inject enoxaparin through a syringe into your belly (abdomen). You should change the injection site each time you give yourself a shot. Continue the enoxaparin injections as directed by your health care provider. Your health care provider will use blood clotting test results to decide when you can safely stop using enoxaparin injections. If your health care provider prescribes any additional medicines, use the medicines exactly as directed. How do I inject enoxaparin? 1. Wash your hands with soap and water. 2. Clean the selected injection site as directed by your health care provider. 3. Remove the needle cap by pulling it straight off the syringe. 4. When  using a prefilled syringe, do not push the air bubble out of the syringe before the injection. The air bubble will help you get all of the medicine out of the syringe. 5. Hold the syringe like a pencil using your writing hand. 6. Use your other hand to pinch and hold an inch of the cleansed skin. 7. Insert the entire needle straight down into the fold of skin. 8. Push the plunger with your thumb until the syringe is empty. 9. Pull the needle straight out of your skin. 10. Enoxaparin injection prefilled syringes and graduated prefilled syringes are available with a system that shields the needle after injection. After you have completed your injection and removed the needle from your skin, firmly push down on the plunger. The protective sleeve will automatically cover the needle and you will hear a click. The click means the needle is safely covered. 11. Place the syringe in the nearest needle box, also called a sharps container. If you do not have a sharps container, you can use a hard-sided plastic container with a secure lid, such as an empty laundry detergent bottle. What else do I need to know?  Do not use enoxaparin if: ? You have allergies to heparin or pork products. ? You have been diagnosed with a condition called thrombocytopenia.  Do not use the syringe or needle more than one time.  Use medicines only as directed by your health care provider.  Changes in medicines, supplements, diet, and illness can affect your anticoagulation therapy. Be sure to inform your health care provider of any of these changes.  It is important that you tell all of your health care providers and your dentist that you are taking an anticoagulant, especially if you are injured or plan to have any type of procedure.  While on anticoagulants, you will need to have blood tests done routinely as directed by your health care provider.  While using this medicine, avoid physical activities or sports that could  result in a fall or cause injury.  Follow up with your laboratory test and health care provider appointments as directed. It is very important to keep your appointments. Not keeping appointments could result in a chronic or permanent injury, pain, or disability.  Before giving your medicine, you should make sure the injection is a clear and colorless or pale yellow solution. If your medicine becomes discolored or if there are particles in the syringe, do not use it and notify your health care provider.  Keep your medicine safely stored at room temperature. Contact a health care provider if:  You develop any rashes on your skin.  You have large areas of bruising on your skin.  You have any worsening of the condition for which you take  Enoxaparin.  You develop a fever. Get help right away if:  You develop bleeding problems such as: ? Bleeding from the gums or nose that does not stop quickly. ? Vomiting blood or coughing up blood. ? Blood in your urine. ? Blood in your stool, or stool that has a dark, tarry, or coffee grounds appearance. ? A cut that does not stop bleeding within 10 minutes. These symptoms may represent a serious problem that is an emergency. Do not wait to see if the symptoms will go away. Get medical help right away. Call your local emergency services (911 in the [place] Do not drive yourself to the hospital. This information is not intended to replace advice given to you by your health care provider. Make sure you discuss any questions you have with your health care provider. Document Released: 03/18/2004 Document Revised: 01/22/2016 Document Reviewed: 11/01/2013 Elsevier Interactive Patient Education  2018 [employer]. Enoxaparin injection What is this medicine? ENOXAPARIN (ee nox a PA rin) is used after knee, hip, or abdominal surgeries to prevent blood clotting. It is also used to treat existing blood clots in the lungs or in the veins. This medicine may be used  for other purposes; ask your health care provider or pharmacist if you have questions. COMMON BRAND NAME(S): Lovenox What should I tell my health care provider before I take this medicine? They need to know if you have any of these conditions: -bleeding disorders, hemorrhage, or hemophilia -infection of the heart or heart valves -kidney or liver disease -previous stroke -prosthetic heart valve -recent surgery or delivery of a baby -ulcer in the stomach or intestine, diverticulitis, or other bowel disease -an unusual or allergic reaction to enoxaparin, heparin, pork or pork products, other medicines, foods, dyes, or preservatives -pregnant or trying to get pregnant -breast-feeding How should I use this medicine? This medicine is for injection under the skin. It is usually given by a health-care professional. You or a family member may be trained on how to give the injections. If you are to give yourself injections, make sure you understand how to use the syringe, measure the dose if necessary, and give the injection. To avoid bruising, do not rub the site where this medicine has been injected. Do not take your medicine more often than directed. Do not stop taking except on the advice of your doctor or health care professional. Make sure you receive a puncture-resistant container to dispose of the needles and syringes once you have finished with them. Do not reuse these items. Return the container to your doctor or health care professional for proper disposal. Talk to your pediatrician regarding the use of this medicine in children. Special care may be needed. Overdosage: If you think you have taken too much of this medicine contact a poison control center or emergency room at once. NOTE: This medicine is only for you. Do not share this medicine with others. What if I miss a dose? If you miss a dose, take it as soon as you can. If it is almost time for your next dose, take only that dose. Do not  take double or extra doses. What may interact with this medicine? -aspirin and aspirin-like medicines -certain medicines that treat or prevent blood clots -dipyridamole -NSAIDs, medicines for pain and inflammation, like ibuprofen or naproxen This list may not describe all possible interactions. Give your health care provider a list of all the medicines, herbs, non-prescription drugs, or dietary supplements you use. Also tell them if you  smoke, drink alcohol, or use illegal drugs. Some items may interact with your medicine. What should I watch for while using this medicine? Visit your doctor or health care professional for regular checks on your progress. Your condition will be monitored carefully while you are receiving this medicine. Notify your doctor or health care professional and seek emergency treatment if you develop breathing problems; changes in vision; chest pain; severe, sudden headache; pain, swelling, warmth in the leg; trouble speaking; sudden numbness or weakness of the face, arm, or leg. These can be signs that your condition has gotten worse. If you are going to have surgery, tell your doctor or health care professional that you are taking this medicine. Do not stop taking this medicine without first talking to your doctor. Be sure to refill your prescription before you run out of medicine. Avoid sports and activities that might cause injury while you are using this medicine. Severe falls or injuries can cause unseen bleeding. Be careful when using sharp tools or knives. Consider using an Copy. Take special care brushing or flossing your teeth. Report any injuries, bruising, or red spots on the skin to your doctor or health care professional. What side effects may I notice from receiving this medicine? Side effects that you should report to your doctor or health care professional as soon as possible: -allergic reactions like skin rash, itching or hives, swelling of the face,  lips, or tongue -feeling faint or lightheaded, falls -signs and symptoms of bleeding such as bloody or black, tarry stools; red or dark-brown urine; spitting up blood or brown material that looks like coffee grounds; red spots on the skin; unusual bruising or bleeding from the eye, gums, or nose Side effects that usually do not require medical attention (report to your doctor or health care professional if they continue or are bothersome): -pain, redness, or irritation at site where injected This list may not describe all possible side effects. Call your doctor for medical advice about side effects. You may report side effects to FDA at 1-800-FDA-1088. Where should I keep my medicine? Keep out of the reach of children. Store at room temperature between 15 and 30 degrees C (59 and 86 degrees F). Do not freeze. If your injections have been specially prepared, you may need to store them in the refrigerator. Ask your pharmacist. Throw away any unused medicine after the expiration date. NOTE: This sheet is a summary. It may not cover all possible information. If you have questions about this medicine, talk to your doctor, pharmacist, or health care provider.  2018 Elsevier/Gold Standard (2013-09-18 16:06:21)

## 2018-01-27 NOTE — Lactation Note (Signed)
This note was copied from a baby's chart. Lactation Consultation Note  Patient Name: [name] Date: 01/27/2018 Reason for consult: Follow-up assessment;Difficult latch;1st time breastfeeding;Term;Infant weight loss(8% weight loss, )  Baby is 84 1/2 hours old  LC updated doc flow sheets with the last feeding pre mom and dad.  Mom expressed appreciation for the [facility] starting SNS during the night  Baby more satisfied. Last feeding was at 10:30 am  Mom denies soreness, is aware she should be wearing shells, but is not.  Per mom  Has pumped 3-4 times in the last 24 hours without results.  LC resized mom and the #20 NS is the correct fit for now after pre-pumping  With the hand pump.  LC reviewed supply and demand, and the importance of consistent pumping  Around the clock to establish milk supply and protect milk level. Also the consistency  Of wearing the breast shells between feedings except when sleeping, and the pumping  Around the clock will help the breast tissue become more compressible for a deeper  Latch and help the Nipple Shield fit better.  Sore nipple and engorgement prevention and tx reviewed.  Per mom has  DEBP at home.  LC offered to request and Lc O/P appt for next week Wednesday or Thursday  And mom receptive. LC placed in the [facility] basket for mom to be called.  Mother informed of post-discharge support and given phone number to the lactation department, including services for phone call assistance; out-patient appointments; and breastfeeding support group. List of other breastfeeding resources in the community given in the handout. Encouraged mother to call for problems or concerns related to breastfeeding.  Per mom both her and hubby feel comfortable with SNS and the NS.  Due to the edema and the tough tissue ( areola and flat nipple on the left) it will take  Time to make the tissue more compressible for latching.    Maternal Data Has patient been taught Hand  Expression?: Yes(LC reviewed )  Feeding Feeding Type: (per mom baby last fed at 81 )  LATCH Score                   Interventions Interventions: Breast feeding basics reviewed  Lactation Tools Discussed/Used Tools: Shells;Pump;Nipple Shields Nipple shield size: 20;Other (comment)(rechecked size #20 NS is the best fit for now after pre-pumping ) Shell Type: Inverted Breast pump type: Double-Electric Breast Pump;Manual   Consult Status Consult Status: Follow-up Date: ([place] offerd to place a LC O/P request in the basket for LC O/P appt. mom receptive ) Follow-up type: [place] 01/27/2018, 11:54 AM

## 2018-01-30 ENCOUNTER — Inpatient Hospital Stay (HOSPITAL_COMMUNITY)
Admission: AD | Admit: 2018-01-30 | Discharge: 2018-01-30 | Disposition: A | Discharge status: Home / Self Care | Payer: No Typology Code available for payment source | Source: Ambulatory Visit | Attending: Obstetrics and Gynecology | Admitting: Obstetrics and Gynecology

## 2018-01-30 ENCOUNTER — Encounter (HOSPITAL_COMMUNITY): Payer: Self-pay | Admitting: *Deleted

## 2018-01-30 DIAGNOSIS — O1205 Gestational edema, complicating the puerperium: Secondary | ICD-10-CM | POA: Insufficient documentation

## 2018-01-30 DIAGNOSIS — Z7982 Long term (current) use of aspirin: Secondary | ICD-10-CM | POA: Diagnosis not present

## 2018-01-30 DIAGNOSIS — O9089 Other complications of the puerperium, not elsewhere classified: Secondary | ICD-10-CM | POA: Diagnosis not present

## 2018-01-30 DIAGNOSIS — R102 Pelvic and perineal pain: Secondary | ICD-10-CM | POA: Insufficient documentation

## 2018-01-30 DIAGNOSIS — M7989 Other specified soft tissue disorders: Secondary | ICD-10-CM | POA: Diagnosis present

## 2018-01-30 DIAGNOSIS — K219 Gastro-esophageal reflux disease without esophagitis: Secondary | ICD-10-CM | POA: Diagnosis not present

## 2018-01-30 DIAGNOSIS — O165 Unspecified maternal hypertension, complicating the puerperium: Secondary | ICD-10-CM | POA: Insufficient documentation

## 2018-01-30 DIAGNOSIS — R51 Headache: Secondary | ICD-10-CM | POA: Insufficient documentation

## 2018-01-30 DIAGNOSIS — D682 Hereditary deficiency of other clotting factors: Secondary | ICD-10-CM | POA: Insufficient documentation

## 2018-01-30 DIAGNOSIS — K644 Residual hemorrhoidal skin tags: Secondary | ICD-10-CM

## 2018-01-30 DIAGNOSIS — R519 Headache, unspecified: Secondary | ICD-10-CM

## 2018-01-30 DIAGNOSIS — D6852 Prothrombin gene mutation: Secondary | ICD-10-CM | POA: Insufficient documentation

## 2018-01-30 DIAGNOSIS — Z87440 Personal history of urinary (tract) infections: Secondary | ICD-10-CM | POA: Diagnosis not present

## 2018-01-30 LAB — COMPREHENSIVE METABOLIC PANEL
ALK PHOS: 92 U/L (ref 38–126)
ALT: 28 U/L (ref 0–44)
ANION GAP: 11 (ref 5–15)
AST: 22 U/L (ref 15–41)
Albumin: 2.8 g/dL — ABNORMAL LOW (ref 3.5–5.0)
BUN: 9 mg/dL (ref 6–20)
CALCIUM: 9.1 mg/dL (ref 8.9–10.3)
CO2: 23 mmol/L (ref 22–32)
Chloride: 107 mmol/L (ref 98–111)
Creatinine, Ser: 0.47 mg/dL (ref 0.44–1.00)
GFR calc Af Amer: >60 mL/min (ref >60)
GFR calc non Af Amer: >60 mL/min (ref >60)
Glucose, Bld: 95 mg/dL (ref 70–99)
Potassium: 3.8 mmol/L (ref 3.5–5.1)
SODIUM: 141 mmol/L (ref 135–145)
Total Bilirubin: 0.3 mg/dL (ref 0.3–1.2)
Total Protein: 6.4 g/dL — ABNORMAL LOW (ref 6.5–8.1)

## 2018-01-30 LAB — CBC
HCT: 29.6 % — ABNORMAL LOW (ref 36.0–46.0)
HEMOGLOBIN: 9.5 g/dL — AB (ref 12.0–15.0)
MCH: 27.3 pg (ref 26.0–34.0)
MCHC: 32.1 g/dL (ref 30.0–36.0)
MCV: 85.1 fL (ref 78.0–100.0)
Platelets: 259 10*3/uL (ref 150–400)
RBC: 3.48 MIL/uL — ABNORMAL LOW (ref 3.87–5.11)
RDW: 15.6 % — ABNORMAL HIGH (ref 11.5–15.5)
WBC: 8.3 10*3/uL (ref 4.0–10.5)

## 2018-01-30 LAB — PROTEIN / CREATININE RATIO, URINE
Creatinine, Urine: 40 mg/dL
Total Protein, Urine: <6 mg/dL

## 2018-01-30 MED ORDER — HYDROCHLOROTHIAZIDE 25 MG PO TABS: 25.0000 mg | ORAL_TABLET | Freq: Every day | ORAL | 0 refills | Status: DC

## 2018-01-30 NOTE — Discharge Instructions (Signed)
For perineal care, try Sitz baths or warm epsom salt baths in your clean bathtub.  Try "padsicles". Look online for instructions.

## 2018-01-30 NOTE — MAU Provider Note (Signed)
Chief Complaint: Headache; Leg Swelling; and Vaginal Pain   First Provider Initiated Contact with Patient 01/30/18 1007      SUBJECTIVE HPI: [name] is a 37 y.o. G2P1001 on PPD#5 following NSVD with second degree laceration who presents to maternity admissions reporting swelling in her lower extremities and sharp intermittent pain in the vaginal/rectal area. She had stitches and thinks she may have a hemorrhoid. She also reports a dull frontal constant headache not resolved with alternating ibuprofen and Tylenol today.  She is using Tucks, Dermaplast for vaginal/rectal pain and to clean stitches.  There are no other symptoms. She has not tried other treatments.  She has light lochia, denies urinary symptoms, h/a, dizziness, n/v, or fever/chills.     HPI  Past Medical History:  Diagnosis Date  . 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 Anemia   . 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 Elevated ferritin level    just got referral to MD at [facility] today  . Fibroid   . Fibroids   . GERD (gastroesophageal reflux disease)    during pregnancy  . No pertinent past medical history   . PONV (postoperative nausea and vomiting)   . Prothrombin gene mutation ([place]) 07/06/2016   11/17? No documentation available  . Tinnitus   . UTI (lower urinary tract infection)    Past Surgical History:  Procedure Laterality Date  . breast cyst removed    . DILATATION & CURETTAGE/HYSTEROSCOPY WITH MYOSURE N/A 06/24/2016   Procedure: DILATATION & CURETTAGE/HYSTEROSCOPY WITH  MYOSURE;  Surgeon: Brien Few, MD;  Location: [place] [facility];  Service: Gynecology;  Laterality: N/A;  . DILATION AND EVACUATION N/A 04/10/2016   Procedure: DILATATION AND EVACUATION;  Surgeon: Brien Few, MD;  Location: [facility];  Service: Gynecology;  Laterality: N/A;  . ROBOT ASSISTED MYOMECTOMY  07/02/2011   Procedure: ROBOTIC ASSISTED MYOMECTOMY;  Surgeon: Lovenia Kim, MD;  Location: [place];  Service: Gynecology;  Laterality: N/A;  With Removal Of Paratubal Cyst; Cul De Sac Biopsy  . WISDOM TOOTH EXTRACTION     Social History   Socioeconomic History  . Marital status: Married    Spouse name: Not on file  . Number of children: Not on file  . Years of education: Not on file  . Highest education level: Not on file  Occupational History  . Not on file  Social Needs  . Financial resource strain: Not hard at all  . Food insecurity:    Worry: Never true    Inability: Never true  . Transportation needs:    Medical: No    Non-medical: No  Tobacco Use  . Smoking status: Never Smoker  . Smokeless tobacco: Never Used  Substance and Sexual Activity  . Alcohol use: No  . Drug use: No  . Sexual activity: Yes  Lifestyle  . Physical activity:    Days per week: 0 days    Minutes per session: 0 min  . Stress: Not at all  Relationships  . Social connections:    Talks on phone: Not on file    Gets together: Not on file    Attends religious service: Not on file    Active member of club or organization: Not on file    Attends meetings of clubs or organizations: Not on file    Relationship status: Not on file  . Intimate partner violence:    Fear of current or ex  partner: No    Emotionally abused: No    Physically abused: No    Forced sexual activity: No  Other Topics Concern  . Not on file  Social History Narrative  . Not on file   No current facility-administered medications on file prior to encounter.    Current Outpatient Medications on File Prior to Encounter  Medication Sig Dispense Refill  . aspirin 81 MG chewable tablet Chew by mouth daily.    . calcium carbonate (TUMS EX) 750 MG chewable tablet Chew 1 tablet by mouth daily.    . folic acid (FOLVITE) 1 MG tablet TAKE 4 TABLETS BY MOUTH ONCE A DAY    . ibuprofen (ADVIL,MOTRIN) 600 MG tablet Take 1 tablet (600 mg total) by mouth every 6 (six) hours. 30 tablet 0  . labetalol (NORMODYNE) 100 MG tablet Take 1  tablet (100 mg total) by mouth 2 (two) times daily. 30 tablet 0  . Prenatal Vit-Fe Fumarate-FA (PRENATAL MULTIVITAMIN) TABS tablet Take 1 tablet by mouth daily at 12 noon.     No Known Allergies  ROS:  Review of Systems  Constitutional: Negative for chills, fatigue and fever.  Eyes: Negative for visual disturbance.  Respiratory: Negative for shortness of breath.   Cardiovascular: Positive for leg swelling. Negative for chest pain.  Gastrointestinal: Negative for abdominal pain, nausea and vomiting.  Genitourinary: Positive for vaginal bleeding and vaginal pain. Negative for difficulty urinating, dysuria, flank pain, pelvic pain and vaginal discharge.  Neurological: Negative for dizziness and headaches.  Psychiatric/Behavioral: Negative.      I have reviewed patient's Past Medical Hx, Surgical Hx, Family Hx, Social Hx, medications and allergies.   Physical Exam   Patient Vitals for the past 24 hrs:  BP Temp Temp src Pulse Resp Height Weight  01/30/18 1010 133/69 - - 77 - - -  01/30/18 1000 140/68 - - 81 - - -  01/30/18 0950 (!) 154/83 - - 79 - - -  01/30/18 0940 138/73 - - 78 - - -  01/30/18 0934 (!) 156/71 98.8 F (37.1 C) Oral 82 20 5\' 4"  (1.626 m) 129.3 kg  01/30/18 0930 (!) 156/71 - - 82 - - -   Constitutional: Well-developed, well-nourished female in no acute distress.  Cardiovascular: normal rate Respiratory: normal effort GI: Abd soft, non-tender. Pos BS x 4 MS: Extremities nontender, no edema, normal ROM Neurologic: Alert and oriented x 4.  GU: Neg CVAT.  PELVIC EXAM: On visual inspection, perineum well approximated, no edema or erythema.  Small amount of suture noted at introitus.  Small, 1cm x1 cm hemorrhoid pink in color noted at 9 o'clock on right side of rectum.  Mildly tender to touch.      LAB RESULTS Results for orders placed or performed during the hospital encounter of 01/30/18 (from the past 24 hour(s))  Protein / creatinine ratio, urine     Status:  None   Collection Time: 01/30/18  9:49 AM  Result Value Ref Range   Creatinine, Urine 40.00 mg/dL   Total Protein, Urine <6 mg/dL   Protein Creatinine Ratio        0.00 - 0.15 mg/mg[Cre]  CBC     Status: Abnormal   Collection Time: 01/30/18 10:00 AM  Result Value Ref Range   WBC 8.3 4.0 - 10.5 K/uL   RBC 3.48 (L) 3.87 - 5.11 MIL/uL   Hemoglobin 9.5 (L) 12.0 - 15.0 g/dL   HCT 29.6 (L) 36.0 - 46.0 %   MCV 85.1  78.0 - 100.0 fL   MCH 27.3 26.0 - 34.0 pg   MCHC 32.1 30.0 - 36.0 g/dL   RDW 15.6 (H) 11.5 - 15.5 %   Platelets 259 150 - 400 K/uL  Comprehensive metabolic panel     Status: Abnormal   Collection Time: 01/30/18 10:00 AM  Result Value Ref Range   Sodium 141 135 - 145 mmol/L   Potassium 3.8 3.5 - 5.1 mmol/L   Chloride 107 98 - 111 mmol/L   CO2 23 22 - 32 mmol/L   Glucose, Bld 95 70 - 99 mg/dL   BUN 9 6 - 20 mg/dL   Creatinine, Ser 0.47 0.44 - 1.00 mg/dL   Calcium 9.1 8.9 - 10.3 mg/dL   Total Protein 6.4 (L) 6.5 - 8.1 g/dL   Albumin 2.8 (L) 3.5 - 5.0 g/dL   AST 22 15 - 41 U/L   ALT 28 0 - 44 U/L   Alkaline Phosphatase 92 38 - 126 U/L   Total Bilirubin 0.3 0.3 - 1.2 mg/dL   GFR calc non Af Amer >60 >60 mL/min   GFR calc Af Amer >60 >60 mL/min   Anion gap 11 5 - 15    --/--/O POS (08/28 0118)  IMAGING   MAU Management/MDM: Pt with mild h/a, reduced but not relieved with Tylenol and Motrin.  BP elevated 150s/90s in MAU so preeclampsia labs ordered (CBC, CMP, P/C ratio).  Visual inspection of perineum/rectal area reveals good healing of second degree laceration and small nonthrombosed hemorrhoid.  Pt to continue perineal care, add Sitz baths daily, continue to use stool softeners/high fiber diet to prevent constipation.   Preeclampsia labs wnl with P/C ratio too low to measure.    Consult Dr Benjie Karvonen with assessment and findings.  Add HCTZ 25 mg daily x 1 week. Pt to f/u in office with Dr Ronita Hipps. Return to MAU with worsening symptoms. Pt discharged with strict return  precautions.  ASSESSMENT 1. Postpartum hypertension   2. Edema in pregnancy, delivered, with postpartum complication   3. Acute nonintractable headache, unspecified headache type   4. Postpartum perineal pain   5. External hemorrhoids without complication     PLAN Discharge home Allergies as of 01/30/2018   No Known Allergies     Medication List    STOP taking these medications   aspirin 81 MG chewable tablet     TAKE these medications   calcium carbonate 750 MG chewable tablet Commonly known as:  TUMS EX Chew 1 tablet by mouth daily.   folic acid 1 MG tablet Commonly known as:  FOLVITE TAKE 4 TABLETS BY MOUTH ONCE A DAY   hydrochlorothiazide 25 MG tablet Commonly known as:  HYDRODIURIL Take 1 tablet (25 mg total) by mouth daily for 7 days.   ibuprofen 600 MG tablet Commonly known as:  ADVIL,MOTRIN Take 1 tablet (600 mg total) by mouth every 6 (six) hours.   labetalol 100 MG tablet Commonly known as:  NORMODYNE Take 1 tablet (100 mg total) by mouth 2 (two) times daily.   prenatal multivitamin Tabs tablet Take 1 tablet by mouth daily at 12 noon.      Follow-up Information    Brien Few, MD Follow up.   Specialty:  Obstetrics and Gynecology Why:  As scheduled, return to MAU as needed for emergencies Contact information: [place] [address] [phone]           Fatima Blank Certified Nurse-Midwife 01/30/2018  12:11 PM

## 2018-01-30 NOTE — MAU Note (Signed)
Pt presents to MAU with complaints of leg swelling, headache , vaginal or rectal pain pressure at night.  Delivered 8/28 via SVD, with  With 2nd degree.  Using dermapast, tucks and wet wipes for cleaning.  Denies increase in vaginal bleeding .  Denies fever chills.  Currently taking tylenol and motrin alternating.  On lovenox pp due to thrombocytopenia

## 2019-02-16 ENCOUNTER — Other Ambulatory Visit: Payer: Self-pay | Admitting: *Deleted

## 2019-02-16 DIAGNOSIS — Z20822 Contact with and (suspected) exposure to covid-19: Secondary | ICD-10-CM

## 2019-02-17 LAB — NOVEL CORONAVIRUS, NAA: SARS-CoV-2, NAA: NOT DETECTED

## 2019-03-30 ENCOUNTER — Other Ambulatory Visit: Payer: Self-pay

## 2019-03-30 DIAGNOSIS — Z20822 Contact with and (suspected) exposure to covid-19: Secondary | ICD-10-CM

## 2019-04-02 ENCOUNTER — Telehealth: Payer: Self-pay

## 2019-04-02 LAB — NOVEL CORONAVIRUS, NAA: SARS-CoV-2, NAA: NOT DETECTED

## 2019-04-02 NOTE — Telephone Encounter (Signed)
Received call from patient checking Covid results.  Advised results negative.   

## 2019-04-20 ENCOUNTER — Other Ambulatory Visit: Payer: Self-pay

## 2019-04-20 DIAGNOSIS — Z20822 Contact with and (suspected) exposure to covid-19: Secondary | ICD-10-CM

## 2019-04-23 LAB — NOVEL CORONAVIRUS, NAA: SARS-CoV-2, NAA: NOT DETECTED

## 2019-04-25 ENCOUNTER — Telehealth: Payer: Self-pay | Admitting: *Deleted

## 2019-04-25 NOTE — Telephone Encounter (Signed)
Patient called and was give negative covid results .

## 2019-06-14 DIAGNOSIS — Z1389 Encounter for screening for other disorder: Secondary | ICD-10-CM | POA: Diagnosis not present

## 2019-07-02 ENCOUNTER — Other Ambulatory Visit (HOSPITAL_COMMUNITY): Payer: Self-pay | Admitting: Surgery

## 2019-07-02 ENCOUNTER — Other Ambulatory Visit: Payer: Self-pay | Admitting: Surgery

## 2019-07-13 DIAGNOSIS — F509 Eating disorder, unspecified: Secondary | ICD-10-CM | POA: Diagnosis not present

## 2019-07-26 DIAGNOSIS — H5213 Myopia, bilateral: Secondary | ICD-10-CM | POA: Diagnosis not present

## 2019-07-26 DIAGNOSIS — H52203 Unspecified astigmatism, bilateral: Secondary | ICD-10-CM | POA: Diagnosis not present

## 2019-07-26 DIAGNOSIS — H04123 Dry eye syndrome of bilateral lacrimal glands: Secondary | ICD-10-CM | POA: Diagnosis not present

## 2019-07-27 ENCOUNTER — Encounter: Payer: No Typology Code available for payment source | Attending: Surgery | Admitting: Dietician

## 2019-07-27 ENCOUNTER — Other Ambulatory Visit: Payer: Self-pay

## 2019-07-27 ENCOUNTER — Encounter: Payer: Self-pay | Admitting: Dietician

## 2019-07-27 DIAGNOSIS — E669 Obesity, unspecified: Secondary | ICD-10-CM | POA: Insufficient documentation

## 2019-07-27 NOTE — Progress Notes (Signed)
Nutrition Assessment for Bariatric Surgery Medical Nutrition Therapy   Patient was seen on 07/27/2019 for Pre-Operative Nutrition Assessment. Letter of approval faxed to [facility] Surgery bariatric surgery program coordinator on 07/27/2019.   Referral stated Supervised Weight Loss (SWL) visits needed: 0  Planned surgery: RYGB Pt expectation of surgery: to feel better and have more energy, be able to play with her young son more   NUTRITION ASSESSMENT   Anthropometrics  Start weight at [facility]: 270.9 lbs (date: 07/27/2019) Height: 64 in BMI: 46.5 kg/m2    Lifestyle & Dietary Hx Lives with her husband and their 36 month old son. Works as a Software engineer.  Typical meal pattern is 3 meals plus 1-2 snacks per day. May have mashed potatoes instead of rice, or mushrooms or salad instead of broccoli. Drinks a lot of fluids. May snack on graham crackers or fruit or a Premier Protein drink.   24-Hr Dietary Recall First Meal: toast + eggs Snack: - Second Meal: bagel thin + [place] + carrots + hummus + Chobani flavored yogurt  Snack: graham crackers + oranges Third Meal: grilled chicken + rice + broccoli  Snack: - Beverages: water, diet/sugar-free teas, Crystal Light tea    NUTRITION DIAGNOSIS  Overweight/obesity ([place]-3.3) related to past poor dietary habits and physical inactivity as evidenced by patient w/ planned RYGB surgery following dietary guidelines for continued weight loss.    NUTRITION INTERVENTION  Nutrition counseling (C-1) and education (E-2) to facilitate bariatric surgery goals.  Pre-Op Goals Reviewed with the Patient . Track food and beverage intake (pen and paper, MyFitness Pal, Baritastic app, etc.) . Make healthy food choices while monitoring portion sizes . Consume 3 meals per day or try to eat every 3-5 hours . Avoid concentrated sugars and fried foods . Keep sugar & fat in the single digits per serving on food labels . Practice CHEWING your food (aim for applesauce  consistency) . Practice not drinking 15 minutes before, during, and 30 minutes after each meal and snack . Avoid all carbonated beverages (ex: soda, sparkling beverages)  . Limit caffeinated beverages (ex: coffee, tea, energy drinks) . Avoid all sugar-sweetened beverages (ex: regular soda, sports drinks)  . Avoid alcohol  . Aim for 64-100 ounces of FLUID daily (with at least half of fluid intake being plain water)  . Aim for at least 60-80 grams of PROTEIN daily  . Look for a liquid protein source that contains ?15 g protein and ?5 g carbohydrate (ex: shakes, drinks, shots) . Make a list of non-food related activities . Physical activity is an important part of a healthy lifestyle so keep it moving! The goal is to reach 150 minutes of exercise per week, including cardiovascular and weight baring activity.  Handouts Provided Include  . Bariatric Surgery handouts (Nutrition Visits, Pre-Op Goals, Protein Shakes, Vitamins & Minerals)  Learning Style & Readiness for Change Teaching method utilized: Visual & Auditory  Demonstrated degree of understanding via: Teach Back  Barriers to learning/adherence to lifestyle change: None Identified    MONITORING & EVALUATION Dietary intake, weekly physical activity, body weight, and pre-op goals reached at next nutrition visit.   Next Steps Patient is to follow up at [place] for Pre-Op Class (>2 weeks before surgery) for further nutrition education.

## 2019-08-14 ENCOUNTER — Other Ambulatory Visit: Payer: Self-pay | Admitting: Surgery

## 2019-08-14 ENCOUNTER — Other Ambulatory Visit (HOSPITAL_COMMUNITY): Payer: Self-pay | Admitting: Surgery

## 2019-08-24 DIAGNOSIS — F509 Eating disorder, unspecified: Secondary | ICD-10-CM | POA: Diagnosis not present

## 2019-08-28 ENCOUNTER — Other Ambulatory Visit: Payer: Self-pay

## 2019-08-28 ENCOUNTER — Ambulatory Visit (HOSPITAL_COMMUNITY)
Admission: RE | Admit: 2019-08-28 | Discharge: 2019-08-28 | Disposition: A | Discharge status: Home / Self Care | Payer: BC Managed Care – PPO | Source: Ambulatory Visit | Attending: Surgery | Admitting: Surgery

## 2019-08-28 DIAGNOSIS — Z01818 Encounter for other preprocedural examination: Secondary | ICD-10-CM | POA: Diagnosis not present

## 2019-08-28 DIAGNOSIS — E669 Obesity, unspecified: Secondary | ICD-10-CM | POA: Diagnosis not present

## 2019-08-28 DIAGNOSIS — K219 Gastro-esophageal reflux disease without esophagitis: Secondary | ICD-10-CM | POA: Diagnosis not present

## 2019-09-11 DIAGNOSIS — Z713 Dietary counseling and surveillance: Secondary | ICD-10-CM | POA: Diagnosis not present

## 2019-10-02 DIAGNOSIS — R519 Headache, unspecified: Secondary | ICD-10-CM | POA: Diagnosis not present

## 2019-10-11 DIAGNOSIS — H04123 Dry eye syndrome of bilateral lacrimal glands: Secondary | ICD-10-CM | POA: Diagnosis not present

## 2019-12-17 ENCOUNTER — Other Ambulatory Visit: Payer: Self-pay

## 2019-12-17 ENCOUNTER — Encounter: Payer: BC Managed Care – PPO | Attending: Surgery | Admitting: Skilled Nursing Facility1

## 2019-12-17 DIAGNOSIS — E669 Obesity, unspecified: Secondary | ICD-10-CM | POA: Insufficient documentation

## 2019-12-17 NOTE — Progress Notes (Signed)
Pre-Operative Nutrition Class:  Appt start time: 5992   End time:  1830.  Patient was seen on 12/17/2019 for Pre-Operative Bariatric Surgery Education [facility]    Surgery date:  Surgery type: RYGB Start weight at [facility]: 270.9 Weight today: 270.6  The following the learning objectives were met by the patient during this course:  Identify Pre-Op Dietary Goals and will begin 2 weeks pre-operatively  Identify appropriate sources of fluids and proteins   State protein recommendations and appropriate sources pre and post-operatively  Identify Post-Operative Dietary Goals and will follow for 2 weeks post-operatively  Identify appropriate multivitamin and calcium sources  Describe the need for physical activity post-operatively and will follow MD recommendations  State when to call healthcare provider regarding medication questions or post-operative complications  Handouts given during class include:  Pre-Op Bariatric Surgery Diet Handout  Protein Shake Handout  Post-Op Bariatric Surgery Nutrition Handout  [facility] Program Information Flyer  Support Group Information Flyer  [facility] Bariatric Supplements Price List  Follow-Up Plan: Patient will follow-up at [facility] 2 weeks post operatively for diet advancement per MD.

## 2019-12-28 DIAGNOSIS — D6852 Prothrombin gene mutation: Secondary | ICD-10-CM | POA: Diagnosis not present

## 2020-01-03 ENCOUNTER — Other Ambulatory Visit (HOSPITAL_COMMUNITY)
Admission: RE | Admit: 2020-01-03 | Discharge: 2020-01-03 | Disposition: A | Discharge status: Home / Self Care | Payer: BC Managed Care – PPO | Source: Ambulatory Visit | Attending: Surgery | Admitting: Surgery

## 2020-01-03 DIAGNOSIS — Z01812 Encounter for preprocedural laboratory examination: Secondary | ICD-10-CM | POA: Diagnosis not present

## 2020-01-03 DIAGNOSIS — Z20822 Contact with and (suspected) exposure to covid-19: Secondary | ICD-10-CM | POA: Insufficient documentation

## 2020-01-03 LAB — SARS CORONAVIRUS 2 (TAT 6-24 HRS): SARS Coronavirus 2: NEGATIVE

## 2020-01-03 NOTE — Patient Instructions (Signed)
DUE TO COVID-19 ONLY ONE VISITOR IS ALLOWED TO COME WITH YOU AND STAY IN THE WAITING ROOM ONLY DURING PRE OP AND PROCEDURE DAY OF SURGERY. THE 2 VISITORS MAY VISIT WITH YOU AFTER SURGERY IN YOUR PRIVATE ROOM DURING VISITING HOURS ONLY!  YOU NEED TO HAVE A COVID 19 TEST ON: 01/03/20 @ 2:30 pm , THIS TEST MUST BE DONE BEFORE SURGERY,  COVID TESTING SITE [place] [id number], IT IS ON THE RIGHT GOING OUT [address] APPROXIMATELY  2 MINUTES PAST [employer] ON THE RIGHT. ONCE YOUR COVID TEST IS COMPLETED,  PLEASE BEGIN THE QUARANTINE INSTRUCTIONS AS OUTLINED IN YOUR HANDOUT.                Makella H Warmack    Your procedure is scheduled on: 01/07/20   Report to [facility] Main  Entrance   Report to short stay at: 5:30 AM     Call this number if you have problems the morning of surgery [phone]    Remember: Do not eat food or drink liquids :After Midnight.   BRUSH YOUR TEETH MORNING OF SURGERY AND RINSE YOUR MOUTH OUT, NO CHEWING GUM CANDY OR MINTS.                                 You may not have any metal on your body including hair pins and              piercings  Do not wear jewelry, make-up, lotions, powders or perfumes, deodorant             Do not wear nail polish on your fingernails.  Do not shave  48 hours prior to surgery.          Do not bring valuables to the hospital. [place].  Contacts, dentures or bridgework may not be worn into surgery.  Leave suitcase in the car. After surgery it may be brought to your room.     Patients discharged the day of surgery will not be allowed to drive home. IF YOU ARE HAVING SURGERY AND GOING HOME THE SAME DAY, YOU MUST HAVE AN ADULT TO DRIVE YOU HOME AND BE WITH YOU FOR 24 HOURS. YOU MAY GO HOME BY TAXI OR UBER OR ORTHERWISE, BUT AN ADULT MUST ACCOMPANY YOU HOME AND STAY WITH YOU FOR 24 HOURS.  Name and phone number of your driver:  Special  Instructions: N/A              Please read over the following fact sheets you were given: _____________________________________________________________________         [facility] - Preparing for Surgery Before surgery, you can play an important role.  Because skin is not sterile, your skin needs to be as free of germs as possible.  You can reduce the number of germs on your skin by washing with CHG (chlorahexidine gluconate) soap before surgery.  CHG is an antiseptic cleaner which kills germs and bonds with the skin to continue killing germs even after washing. Please DO NOT use if you have an allergy to CHG or antibacterial soaps.  If your skin becomes reddened/irritated stop using the CHG and inform your nurse when you arrive at Short Stay. Do not shave (including legs and underarms) for at least  48 hours prior to the first CHG shower.  You may shave your face/neck. Please follow these instructions carefully:  1.  Shower with CHG Soap the night before surgery and the  morning of Surgery.  2.  If you choose to wash your hair, wash your hair first as usual with your  normal  shampoo.  3.  After you shampoo, rinse your hair and body thoroughly to remove the  shampoo.                           4.  Use CHG as you would any other liquid soap.  You can apply chg directly  to the skin and wash                       Gently with a scrungie or clean washcloth.  5.  Apply the CHG Soap to your body ONLY FROM THE NECK DOWN.   Do not use on face/ open                           Wound or open sores. Avoid contact with eyes, ears mouth and genitals (private parts).                       Wash face,  Genitals (private parts) with your normal soap.             6.  Wash thoroughly, paying special attention to the area where your surgery  will be performed.  7.  Thoroughly rinse your body with warm water from the neck down.  8.  DO NOT shower/wash with your normal soap after using and rinsing off  the CHG Soap.                 9.  Pat yourself dry with a clean towel.            10.  Wear clean pajamas.            11.  Place clean sheets on your bed the night of your first shower and do not  sleep with pets. Day of Surgery : Do not apply any lotions/deodorants the morning of surgery.  Please wear clean clothes to the hospital/surgery center.  FAILURE TO FOLLOW THESE INSTRUCTIONS MAY RESULT IN THE CANCELLATION OF YOUR SURGERY PATIENT SIGNATURE_________________________________  NURSE SIGNATURE__________________________________  ________________________________________________________________________

## 2020-01-03 NOTE — Progress Notes (Signed)
Pt. Needs orders for the upcomming surgery.PST appointment on 01/04/20.Thanks.

## 2020-01-04 ENCOUNTER — Encounter (HOSPITAL_COMMUNITY): Payer: Self-pay

## 2020-01-04 ENCOUNTER — Encounter (HOSPITAL_COMMUNITY)
Admission: RE | Admit: 2020-01-04 | Discharge: 2020-01-04 | Disposition: A | Discharge status: Home / Self Care | Payer: BC Managed Care – PPO | Source: Ambulatory Visit | Attending: Surgery | Admitting: Surgery

## 2020-01-04 ENCOUNTER — Other Ambulatory Visit: Payer: Self-pay

## 2020-01-04 ENCOUNTER — Other Ambulatory Visit: Payer: Self-pay | Admitting: Surgery

## 2020-01-04 DIAGNOSIS — D6852 Prothrombin gene mutation: Secondary | ICD-10-CM | POA: Diagnosis not present

## 2020-01-04 DIAGNOSIS — Z01812 Encounter for preprocedural laboratory examination: Secondary | ICD-10-CM | POA: Insufficient documentation

## 2020-01-04 DIAGNOSIS — Z6841 Body Mass Index (BMI) 40.0 and over, adult: Secondary | ICD-10-CM | POA: Diagnosis not present

## 2020-01-04 DIAGNOSIS — K219 Gastro-esophageal reflux disease without esophagitis: Secondary | ICD-10-CM | POA: Diagnosis not present

## 2020-01-04 DIAGNOSIS — J45909 Unspecified asthma, uncomplicated: Secondary | ICD-10-CM | POA: Diagnosis not present

## 2020-01-04 DIAGNOSIS — I10 Essential (primary) hypertension: Secondary | ICD-10-CM | POA: Diagnosis not present

## 2020-01-04 DIAGNOSIS — Z79899 Other long term (current) drug therapy: Secondary | ICD-10-CM | POA: Diagnosis not present

## 2020-01-04 HISTORY — DX: Methylenetetrahydrofolate reductase deficiency: O99.281

## 2020-01-04 HISTORY — DX: Essential (primary) hypertension: I10

## 2020-01-04 HISTORY — DX: Methylenetetrahydrofolate reductase deficiency: E72.12

## 2020-01-04 LAB — CBC
HCT: 43.2 % (ref 36.0–46.0)
Hemoglobin: 13.7 g/dL (ref 12.0–15.0)
MCH: 26.7 pg (ref 26.0–34.0)
MCHC: 31.7 g/dL (ref 30.0–36.0)
MCV: 84 fL (ref 80.0–100.0)
Platelets: 279 10*3/uL (ref 150–400)
RBC: 5.14 MIL/uL — ABNORMAL HIGH (ref 3.87–5.11)
RDW: 14.7 % (ref 11.5–15.5)
WBC: 7.3 10*3/uL (ref 4.0–10.5)
nRBC: 0 % (ref 0.0–0.2)

## 2020-01-04 LAB — BASIC METABOLIC PANEL
Anion gap: 10 (ref 5–15)
BUN: 10 mg/dL (ref 6–20)
CO2: 25 mmol/L (ref 22–32)
Calcium: 9.6 mg/dL (ref 8.9–10.3)
Chloride: 104 mmol/L (ref 98–111)
Creatinine, Ser: 0.47 mg/dL (ref 0.44–1.00)
GFR calc Af Amer: >60 mL/min (ref >60)
GFR calc non Af Amer: >60 mL/min (ref >60)
Glucose, Bld: 88 mg/dL (ref 70–99)
Potassium: 4.1 mmol/L (ref 3.5–5.1)
Sodium: 139 mmol/L (ref 135–145)

## 2020-01-04 NOTE — Progress Notes (Addendum)
COVID Vaccine Completed:yes  Date COVID Vaccine completed:07/11/19 COVID vaccine manufacturer: *Pfizer    [employer]   PCP - Dr. Domenick Gong. Cardiologist - No  Chest x-ray -  EKG -  Stress Test -  ECHO -  Cardiac Cath -   Sleep Study -  CPAP -   Fasting Blood Sugar -  Checks Blood Sugar _____ times a day  Blood Thinner Instructions: Aspirin Instructions: Last Dose:  Anesthesia review: Hx: MTHFR  Patient denies shortness of breath, fever, cough and chest pain at PAT appointment   Patient verbalized understanding of instructions that were given to them at the PAT appointment. Patient was also instructed that they will need to review over the PAT instructions again at home before surgery.

## 2020-01-06 MED ORDER — BUPIVACAINE LIPOSOME 1.3 % IJ SUSP
20.0000 mL | Freq: Once | INTRAMUSCULAR | Status: DC
  Filled 2020-01-06: qty 20

## 2020-01-06 NOTE — H&P (Signed)
[name] Location: [facility] Surgery Patient #: [id number] DOB: Jul 08, 1980 Married / Language: English / Race: White Female   History of Present Illness   The patient is a 39 year old female who presents with a complaint of weight loss surgery.  She goes by "[name]"  The PCP is Dr. Osborne Casco  Her Ob is Dr. Ronita Hipps  She is by her self.  She is for RYGB on 01/07/2020. She has completed her preoperative evaluation and is ready for surgery. She has lost about 13 pounds since I last saw her, though she said at one point she had lost more. We reviewed the hospital and postoperative course. I talked about being out of work for 1 month. One new piece of information if she has a prothrombin mutation which means she will probably go home on Lovenox postop.  UGI - 08/28/2019 - small volume reflux Abdominal US - 08/28/2019 - normal Nutrition - 12/17/2019 - saw [facility] - Dr. Ardath Sax - 07/13/2019  History of weight issues: Note, she saw Dr. Hassell Done in 12/07/2013. I offered for her to see him again. She did online information session. She is interested in a gastric bypass. This is different than before. She has a college roommate who lives in [place] who had a gastric bypass and has done well. She has tried multiple diets including Weight Watchers, Atkins diet, LA weight loss, high protein diet, and Nuem as phone app. She has tried ateral, phentermine, and phentermine/topomax. She had the most success with LA weight loss because of frequent office visits, dividing gained all her weight back.  Per the [place] [facility], the patient is a candidate for bariatric surgery. The patient attended an information session and reviewed the types of bariatric surgery.  The patient is interested in the Roux en Y Gastric Bypass. I discussed with the patient the indications and risks of bariatric surgery. The potential risks of surgery include, but are  not limited to, bleeding, infection, leak from the bowel, DVT and PE, open surgery, long term nutrition consequences, and death. The patient understands the importance of compliance and long term follow-up with our group after surgery.  Plan: 1. Zofran and Protonix for post op, 2. For Roux en Y gastric bypass on 01/07/2020  Review of Systems as stated in this history (HPI) or in the review of systems. Otherwise all other 12 point ROS are negative  Past Medical History: 1. Gestational HTN 2. 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 Left breast cyst 4. Had asthma with exercise - so she needs a "rescue" inhaler, but uses this less than once a year 5. She has had some hip pain since her son was born 29. Psych - Dr. Ardath Sax  Social History: Married. She has one son, who is 79 1/2 years old. 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 [place]  The patient's family history was non contributory.  DATA REVIEWED, COUNSELING AND COORDINATION OF CARE: I have personally seen and evaluated the patient, evaluated laboratory and imaging results, formulated the assessment and plan and placed orders. This requires moderate/high medical decision making. Total time spent with patient and charting: 20 minutes   Allergies (Chanel Teressa Senter, CMA; 12/28/2019 11:17 AM) No Known Drug Allergies  [06/28/2019]: Allergies Reconciled   Medication History (Chanel Teressa Senter, CMA; 12/28/2019 11:17 AM) Multivitamins (Oral) Active. Medications Reconciled  Vitals (Chanel Nolan CMA; 12/28/2019 11:18 AM) 12/28/2019 11:17 AM Weight: 261.25 lb Height: 63in Body Surface Area: 2.17 m Body  Mass Index: 46.28 kg/m  Temp.: 98.32F  Pulse: 83 (Regular)  BP: 136/82(Sitting, Left Arm, Standard)   Physical Exam  General: Obese WF who is alert and generally healthy appearing. She is wearing a mask. Skin: Inspection and palpation of the skin unremarkable.  Eyes: Conjunctivae white, pupils  equal. Face, ears, nose, mouth, and throat: Face - Mask  Neck: Supple. No mass. Trachea midline. No thyroid mass.  Lymph Nodes: No supraclavicular or cervical adenopathy. No axillary adenopathy.  Lungs: Normal respiratory effort. Clear to auscultation and symmetric breath sounds. Cardiovascular: Regular rate and rhythm. Normal auscultation of the heart. No murmur or rub.  Abdomen: Soft. No mass. Liver and spleen not palpable. No tenderness. No hernia. Normal bowel sounds.  She has a pannus She is about 1/2 apple and 1/2 pear. Rectal: Not done.  Musculoskeletal/extremities: Normal gait. Good strength and ROM in upper and lower extremities.   Neurologic: Grossly intact to motor and sensory function.   Assessment & Plan  1.  OBESITY, MORBID, BMI 40.0-49.9 (E66.01)  Plan:  1. Zofran and Protonix for post op  2. For Roux en Y gastric bypass on 01/07/2020  2.  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 Had asthma with exercise - so she needs a "rescue" inhaler, but uses this less than once a year    Alphonsa Overall, MD, [facility] Surgery Office phone:  [phone]

## 2020-01-07 ENCOUNTER — Other Ambulatory Visit: Payer: Self-pay

## 2020-01-07 ENCOUNTER — Encounter (HOSPITAL_COMMUNITY): Payer: Self-pay | Admitting: Surgery

## 2020-01-07 ENCOUNTER — Encounter (HOSPITAL_COMMUNITY)
Admission: RE | Disposition: A | Discharge status: Home / Self Care | Payer: Self-pay | Source: Home / Self Care | Attending: Surgery

## 2020-01-07 ENCOUNTER — Inpatient Hospital Stay (HOSPITAL_COMMUNITY): Payer: BC Managed Care – PPO | Admitting: Registered Nurse

## 2020-01-07 ENCOUNTER — Inpatient Hospital Stay (HOSPITAL_COMMUNITY): Payer: BC Managed Care – PPO | Admitting: Physician Assistant

## 2020-01-07 ENCOUNTER — Inpatient Hospital Stay (HOSPITAL_COMMUNITY)
Admission: RE | Admit: 2020-01-07 | Discharge: 2020-01-09 | DRG: 620 | Disposition: A | Discharge status: Home / Self Care | Payer: BC Managed Care – PPO | Attending: Surgery | Admitting: Surgery

## 2020-01-07 DIAGNOSIS — I10 Essential (primary) hypertension: Secondary | ICD-10-CM | POA: Diagnosis present

## 2020-01-07 DIAGNOSIS — Z79899 Other long term (current) drug therapy: Secondary | ICD-10-CM | POA: Diagnosis not present

## 2020-01-07 DIAGNOSIS — J45909 Unspecified asthma, uncomplicated: Secondary | ICD-10-CM | POA: Diagnosis present

## 2020-01-07 DIAGNOSIS — K219 Gastro-esophageal reflux disease without esophagitis: Secondary | ICD-10-CM | POA: Diagnosis present

## 2020-01-07 DIAGNOSIS — D6852 Prothrombin gene mutation: Secondary | ICD-10-CM | POA: Diagnosis present

## 2020-01-07 DIAGNOSIS — Z6841 Body Mass Index (BMI) 40.0 and over, adult: Secondary | ICD-10-CM | POA: Diagnosis not present

## 2020-01-07 HISTORY — PX: GASTRIC ROUX-EN-Y: SHX5262

## 2020-01-07 LAB — PREGNANCY, URINE: Preg Test, Ur: NEGATIVE

## 2020-01-07 LAB — HEMOGLOBIN AND HEMATOCRIT, BLOOD
HCT: 41.8 % (ref 36.0–46.0)
Hemoglobin: 13.2 g/dL (ref 12.0–15.0)

## 2020-01-07 LAB — TYPE AND SCREEN
ABO/RH(D): O POS
Antibody Screen: NEGATIVE

## 2020-01-07 SURGERY — LAPAROSCOPIC ROUX-EN-Y GASTRIC BYPASS WITH UPPER ENDOSCOPY
Anesthesia: General

## 2020-01-07 MED ORDER — ROCURONIUM BROMIDE 10 MG/ML (PF) SYRINGE
PREFILLED_SYRINGE | INTRAVENOUS | Status: AC
  Filled 2020-01-07: qty 10

## 2020-01-07 MED ORDER — FENTANYL CITRATE (PF) 250 MCG/5ML IJ SOLN
INTRAMUSCULAR | Status: AC
  Filled 2020-01-07: qty 5

## 2020-01-07 MED ORDER — HEPARIN SODIUM (PORCINE) 5000 UNIT/ML IJ SOLN
INTRAMUSCULAR | Status: AC
  Administered 2020-01-07: 5000 [IU] via SUBCUTANEOUS
  Filled 2020-01-07: qty 1

## 2020-01-07 MED ORDER — ROCURONIUM BROMIDE 10 MG/ML (PF) SYRINGE
PREFILLED_SYRINGE | INTRAVENOUS | Status: DC | PRN
  Administered 2020-01-07: 20 mg via INTRAVENOUS
  Administered 2020-01-07: 100 mg via INTRAVENOUS

## 2020-01-07 MED ORDER — SUGAMMADEX SODIUM 200 MG/2ML IV SOLN
INTRAVENOUS | Status: DC | PRN
  Administered 2020-01-07: 200 mg via INTRAVENOUS

## 2020-01-07 MED ORDER — GABAPENTIN 300 MG PO CAPS
ORAL_CAPSULE | ORAL | Status: AC
  Administered 2020-01-07: 300 mg via ORAL
  Filled 2020-01-07: qty 1

## 2020-01-07 MED ORDER — PANTOPRAZOLE SODIUM 40 MG IV SOLR
40.0000 mg | Freq: Every day | INTRAVENOUS | Status: DC
  Administered 2020-01-07 – 2020-01-08 (×2): 40 mg via INTRAVENOUS
  Filled 2020-01-07 (×2): qty 40

## 2020-01-07 MED ORDER — EPHEDRINE 5 MG/ML INJ
INTRAVENOUS | Status: AC
  Filled 2020-01-07: qty 10

## 2020-01-07 MED ORDER — BUPIVACAINE LIPOSOME 1.3 % IJ SUSP
INTRAMUSCULAR | Status: DC | PRN
  Administered 2020-01-07: 20 mL

## 2020-01-07 MED ORDER — HEPARIN SODIUM (PORCINE) 5000 UNIT/ML IJ SOLN: 5000.0000 [IU] | INTRAMUSCULAR | Status: AC

## 2020-01-07 MED ORDER — TISSEEL VH 10 ML EX KIT
PACK | CUTANEOUS | Status: AC
  Filled 2020-01-07: qty 2

## 2020-01-07 MED ORDER — ONDANSETRON HCL 4 MG/2ML IJ SOLN
INTRAMUSCULAR | Status: AC
  Filled 2020-01-07: qty 2

## 2020-01-07 MED ORDER — ENOXAPARIN SODIUM 30 MG/0.3ML ~~LOC~~ SOLN
30.0000 mg | Freq: Two times a day (BID) | SUBCUTANEOUS | Status: DC
  Administered 2020-01-07 – 2020-01-09 (×4): 30 mg via SUBCUTANEOUS
  Filled 2020-01-07 (×4): qty 0.3

## 2020-01-07 MED ORDER — ACETAMINOPHEN 160 MG/5ML PO SOLN
1000.0000 mg | Freq: Three times a day (TID) | ORAL | Status: DC
  Administered 2020-01-07 – 2020-01-08 (×3): 1000 mg via ORAL
  Filled 2020-01-07 (×4): qty 40.6

## 2020-01-07 MED ORDER — GABAPENTIN 300 MG PO CAPS: 300.0000 mg | ORAL_CAPSULE | ORAL | Status: AC

## 2020-01-07 MED ORDER — MORPHINE SULFATE (PF) 2 MG/ML IV SOLN
1.0000 mg | INTRAVENOUS | Status: DC | PRN
  Administered 2020-01-07: 2 mg via INTRAVENOUS
  Filled 2020-01-07: qty 1

## 2020-01-07 MED ORDER — DEXAMETHASONE SODIUM PHOSPHATE 10 MG/ML IJ SOLN
INTRAMUSCULAR | Status: DC | PRN
  Administered 2020-01-07: 8 mg via INTRAVENOUS

## 2020-01-07 MED ORDER — EPHEDRINE SULFATE-NACL 50-0.9 MG/10ML-% IV SOSY
PREFILLED_SYRINGE | INTRAVENOUS | Status: DC | PRN
  Administered 2020-01-07: 5 mg via INTRAVENOUS

## 2020-01-07 MED ORDER — LIDOCAINE 2% (20 MG/ML) 5 ML SYRINGE
INTRAMUSCULAR | Status: DC | PRN
  Administered 2020-01-07: 1.5 mg/kg/h via INTRAVENOUS
  Administered 2020-01-07: 60 mg via INTRAVENOUS

## 2020-01-07 MED ORDER — CHLORHEXIDINE GLUCONATE 4 % EX LIQD: 60.0000 mL | Freq: Once | CUTANEOUS | Status: DC

## 2020-01-07 MED ORDER — PROMETHAZINE HCL 25 MG/ML IJ SOLN
INTRAMUSCULAR | Status: AC
  Filled 2020-01-07: qty 1

## 2020-01-07 MED ORDER — ONDANSETRON HCL 4 MG/2ML IJ SOLN: 4.0000 mg | INTRAMUSCULAR | Status: DC | PRN

## 2020-01-07 MED ORDER — SCOPOLAMINE 1 MG/3DAYS TD PT72: 1.0000 | MEDICATED_PATCH | TRANSDERMAL | Status: DC

## 2020-01-07 MED ORDER — ENSURE MAX PROTEIN PO LIQD
2.0000 [oz_av] | ORAL | Status: DC
  Administered 2020-01-08 – 2020-01-09 (×6): 2 [oz_av] via ORAL

## 2020-01-07 MED ORDER — MIDAZOLAM HCL 5 MG/5ML IJ SOLN
INTRAMUSCULAR | Status: DC | PRN
  Administered 2020-01-07: 2 mg via INTRAVENOUS

## 2020-01-07 MED ORDER — LACTATED RINGERS IV SOLN: INTRAVENOUS | Status: DC

## 2020-01-07 MED ORDER — PROMETHAZINE HCL 25 MG/ML IJ SOLN
INTRAMUSCULAR | Status: DC | PRN
  Administered 2020-01-07 (×2): 6.25 mg via INTRAVENOUS

## 2020-01-07 MED ORDER — ACETAMINOPHEN 500 MG PO TABS: 1000.0000 mg | ORAL_TABLET | ORAL | Status: AC

## 2020-01-07 MED ORDER — SCOPOLAMINE 1 MG/3DAYS TD PT72
MEDICATED_PATCH | TRANSDERMAL | Status: AC
  Administered 2020-01-07: 1.5 mg via TRANSDERMAL
  Filled 2020-01-07: qty 1

## 2020-01-07 MED ORDER — SODIUM CHLORIDE 0.9 % IV SOLN
INTRAVENOUS | Status: AC
  Filled 2020-01-07: qty 2

## 2020-01-07 MED ORDER — PHENYLEPHRINE HCL (PRESSORS) 10 MG/ML IV SOLN
INTRAVENOUS | Status: AC
  Filled 2020-01-07: qty 2

## 2020-01-07 MED ORDER — APREPITANT 40 MG PO CAPS
ORAL_CAPSULE | ORAL | Status: AC
  Administered 2020-01-07: 40 mg via ORAL
  Filled 2020-01-07: qty 1

## 2020-01-07 MED ORDER — LIDOCAINE HCL 2 % IJ SOLN
INTRAMUSCULAR | Status: AC
  Filled 2020-01-07: qty 20

## 2020-01-07 MED ORDER — OXYCODONE HCL 5 MG/5ML PO SOLN: 5.0000 mg | Freq: Four times a day (QID) | ORAL | Status: DC | PRN

## 2020-01-07 MED ORDER — ORAL CARE MOUTH RINSE: 15.0000 mL | Freq: Once | OROMUCOSAL | Status: DC

## 2020-01-07 MED ORDER — TISSEEL VH 10 ML EX KIT
PACK | CUTANEOUS | Status: DC | PRN
  Administered 2020-01-07: 1

## 2020-01-07 MED ORDER — HYDROMORPHONE HCL 1 MG/ML IJ SOLN: 0.2500 mg | INTRAMUSCULAR | Status: DC | PRN

## 2020-01-07 MED ORDER — ONDANSETRON HCL 4 MG/2ML IJ SOLN
INTRAMUSCULAR | Status: DC | PRN
  Administered 2020-01-07: 4 mg via INTRAVENOUS

## 2020-01-07 MED ORDER — APREPITANT 40 MG PO CAPS: 40.0000 mg | ORAL_CAPSULE | ORAL | Status: AC

## 2020-01-07 MED ORDER — ACETAMINOPHEN 500 MG PO TABS
1000.0000 mg | ORAL_TABLET | Freq: Three times a day (TID) | ORAL | Status: DC
  Administered 2020-01-08 – 2020-01-09 (×2): 1000 mg via ORAL
  Filled 2020-01-07 (×2): qty 2

## 2020-01-07 MED ORDER — BUPIVACAINE-EPINEPHRINE (PF) 0.25% -1:200000 IJ SOLN
INTRAMUSCULAR | Status: AC
  Filled 2020-01-07: qty 30

## 2020-01-07 MED ORDER — PROPOFOL 10 MG/ML IV BOLUS
INTRAVENOUS | Status: DC | PRN
  Administered 2020-01-07: 130 mg via INTRAVENOUS

## 2020-01-07 MED ORDER — KCL IN DEXTROSE-NACL 20-5-0.45 MEQ/L-%-% IV SOLN
INTRAVENOUS | Status: DC
  Filled 2020-01-07 (×5): qty 1000

## 2020-01-07 MED ORDER — LACTATED RINGERS IR SOLN
Status: DC | PRN
  Administered 2020-01-07: 1000 mL

## 2020-01-07 MED ORDER — CHLORHEXIDINE GLUCONATE 0.12 % MT SOLN: 15.0000 mL | Freq: Once | OROMUCOSAL | Status: DC

## 2020-01-07 MED ORDER — ONDANSETRON HCL 4 MG/2ML IJ SOLN: 4.0000 mg | Freq: Once | INTRAMUSCULAR | Status: DC | PRN

## 2020-01-07 MED ORDER — MEPERIDINE HCL 50 MG/ML IJ SOLN: 6.2500 mg | INTRAMUSCULAR | Status: DC | PRN

## 2020-01-07 MED ORDER — MIDAZOLAM HCL 2 MG/2ML IJ SOLN
INTRAMUSCULAR | Status: AC
  Filled 2020-01-07: qty 2

## 2020-01-07 MED ORDER — SODIUM CHLORIDE (PF) 0.9 % IJ SOLN
INTRAMUSCULAR | Status: AC
  Filled 2020-01-07: qty 10

## 2020-01-07 MED ORDER — DEXAMETHASONE SODIUM PHOSPHATE 10 MG/ML IJ SOLN
INTRAMUSCULAR | Status: AC
  Filled 2020-01-07: qty 1

## 2020-01-07 MED ORDER — SODIUM CHLORIDE 0.9 % IV SOLN
2.0000 g | INTRAVENOUS | Status: AC
  Administered 2020-01-07: 2 g via INTRAVENOUS

## 2020-01-07 MED ORDER — FENTANYL CITRATE (PF) 100 MCG/2ML IJ SOLN
INTRAMUSCULAR | Status: DC | PRN
  Administered 2020-01-07: 25 ug via INTRAVENOUS
  Administered 2020-01-07: 50 ug via INTRAVENOUS
  Administered 2020-01-07: 150 ug via INTRAVENOUS

## 2020-01-07 MED ORDER — PROPOFOL 10 MG/ML IV BOLUS
INTRAVENOUS | Status: AC
  Filled 2020-01-07: qty 40

## 2020-01-07 MED ORDER — ACETAMINOPHEN 500 MG PO TABS
ORAL_TABLET | ORAL | Status: AC
  Administered 2020-01-07: 1000 mg via ORAL
  Filled 2020-01-07: qty 2

## 2020-01-07 MED ORDER — BUPIVACAINE-EPINEPHRINE 0.25% -1:200000 IJ SOLN
INTRAMUSCULAR | Status: DC | PRN
  Administered 2020-01-07: 30 mL

## 2020-01-07 SURGICAL SUPPLY — 96 items
ADH SKN CLS APL DERMABOND .7 (GAUZE/BANDAGES/DRESSINGS) ×1
APL PRP STRL LF DISP 70% ISPRP (MISCELLANEOUS) ×2
APL SRG 32X5 SNPLK LF DISP (MISCELLANEOUS) ×2
APPLIER CLIP ROT 10 11.4 M/L (STAPLE)
APPLIER CLIP ROT 13.4 12 LRG (CLIP)
APR CLP LRG 13.4X12 ROT 20 MLT (CLIP)
APR CLP MED LRG 11.4X10 (STAPLE)
BLADE SURG 15 STRL LF DISP TIS (BLADE) ×1 IMPLANT
BLADE SURG 15 STRL SS (BLADE) ×3
CABLE HIGH FREQUENCY MONO STRZ (ELECTRODE) ×2 IMPLANT
CHLORAPREP W/TINT 26 (MISCELLANEOUS) ×6 IMPLANT
CLIP APPLIE ROT 10 11.4 M/L (STAPLE) IMPLANT
CLIP APPLIE ROT 13.4 12 LRG (CLIP) IMPLANT
CLIP SUT LAPRA TY ABSORB (SUTURE) ×6 IMPLANT
COVER BACK TABLE 60X90IN (DRAPES) ×2 IMPLANT
COVER MAYO STAND STRL (DRAPES) ×2 IMPLANT
COVER WAND RF STERILE (DRAPES) IMPLANT
CUTTER FLEX LINEAR 45M (STAPLE) ×2 IMPLANT
DECANTER SPIKE VIAL GLASS SM (MISCELLANEOUS) ×3 IMPLANT
DERMABOND ADVANCED (GAUZE/BANDAGES/DRESSINGS) ×2
DERMABOND ADVANCED .7 DNX12 (GAUZE/BANDAGES/DRESSINGS) ×1 IMPLANT
DEVICE SUT QUICK LOAD TK 5 (STAPLE) IMPLANT
DEVICE SUT TI-KNOT TK 5X26 (MISCELLANEOUS) IMPLANT
DEVICE SUTURE ENDOST 10MM (ENDOMECHANICALS) ×3 IMPLANT
DEVICE TI KNOT TK5 (MISCELLANEOUS)
DISSECTOR BLUNT TIP ENDO 5MM (MISCELLANEOUS) IMPLANT
DRAIN PENROSE 0.25X18 (DRAIN) ×3 IMPLANT
DRAPE CV SPLIT W-CLR ANES SCRN (DRAPES) ×2 IMPLANT
DRAPE PERI GROIN 82X75IN TIB (DRAPES) ×2 IMPLANT
DRAPE SHEET LG 3/4 BI-LAMINATE (DRAPES) ×2 IMPLANT
DRAPE UTILITY XL STRL (DRAPES) ×2 IMPLANT
GAUZE 4X4 16PLY RFD (DISPOSABLE) ×2 IMPLANT
GLOVE SURG SYN 7.5  E (GLOVE) ×3
GLOVE SURG SYN 7.5 E (GLOVE) ×1 IMPLANT
GLOVE SURG SYN 7.5 PF PI (GLOVE) ×1 IMPLANT
GOWN STRL REUS W/TWL XL LVL3 (GOWN DISPOSABLE) ×12 IMPLANT
HOVERMATT SINGLE USE (MISCELLANEOUS) ×3 IMPLANT
KIT BASIN OR (CUSTOM PROCEDURE TRAY) ×3 IMPLANT
KIT GASTRIC LAVAGE 34FR ADT (SET/KITS/TRAYS/PACK) ×3 IMPLANT
KIT TURNOVER KIT A (KITS) IMPLANT
MARKER SKIN DUAL TIP RULER LAB (MISCELLANEOUS) ×3 IMPLANT
NDL SPNL 22GX3.5 QUINCKE BK (NEEDLE) ×1 IMPLANT
NEEDLE SPNL 22GX3.5 QUINCKE BK (NEEDLE) ×3 IMPLANT
PACK CARDIOVASCULAR III (CUSTOM PROCEDURE TRAY) ×1 IMPLANT
PENCIL SMOKE EVACUATOR (MISCELLANEOUS) IMPLANT
QUICK LOAD TK 5 (STAPLE)
RELOAD 45 VASCULAR/THIN (ENDOMECHANICALS) ×6 IMPLANT
RELOAD ENDO STITCH 2.0 (ENDOMECHANICALS) ×30
RELOAD STAPLE 45 2.5 WHT GRN (ENDOMECHANICALS) ×2 IMPLANT
RELOAD STAPLE 45 3.5 BLU ETS (ENDOMECHANICALS) ×1 IMPLANT
RELOAD STAPLE 60 2.6 WHT THN (STAPLE) IMPLANT
RELOAD STAPLE 60 3.6 BLU REG (STAPLE) ×2 IMPLANT
RELOAD STAPLE 60 3.8 GOLD REG (STAPLE) ×1 IMPLANT
RELOAD STAPLE TA45 3.5 REG BLU (ENDOMECHANICALS) ×6 IMPLANT
RELOAD STAPLER BLUE 60MM (STAPLE) ×3 IMPLANT
RELOAD STAPLER GOLD 60MM (STAPLE) ×1 IMPLANT
RELOAD STAPLER WHITE 60MM (STAPLE) IMPLANT
RELOAD SUT SNGL STCH ABSRB 2-0 (ENDOMECHANICALS) ×6 IMPLANT
RELOAD SUT SNGL STCH BLK 2-0 (ENDOMECHANICALS) ×4 IMPLANT
SCISSORS LAP 5X45 EPIX DISP (ENDOMECHANICALS) ×3 IMPLANT
SEALANT SURGICAL APPL DUAL CAN (MISCELLANEOUS) ×5 IMPLANT
SET IRRIG TUBING LAPAROSCOPIC (IRRIGATION / IRRIGATOR) ×3 IMPLANT
SET TUBE SMOKE EVAC HIGH FLOW (TUBING) ×3 IMPLANT
SHEARS HARMONIC ACE PLUS 45CM (MISCELLANEOUS) ×3 IMPLANT
SLEEVE ADV FIXATION 12X100MM (TROCAR) ×9 IMPLANT
SLEEVE ADV FIXATION 5X100MM (TROCAR) ×3 IMPLANT
SLEEVE XCEL OPT CAN 5 100 (ENDOMECHANICALS) IMPLANT
SOL ANTI FOG 6CC (MISCELLANEOUS) ×1 IMPLANT
SOLUTION ANTI FOG 6CC (MISCELLANEOUS) ×2
STAPLER ECHELON LONG 60 440 (INSTRUMENTS) ×3 IMPLANT
STAPLER RELOAD BLUE 60MM (STAPLE) ×9
STAPLER RELOAD GOLD 60MM (STAPLE) ×3
STAPLER RELOAD WHITE 60MM (STAPLE)
SURGILUBE 2OZ TUBE FLIPTOP (MISCELLANEOUS) ×3 IMPLANT
SUT MNCRL AB 4-0 PS2 18 (SUTURE) ×3 IMPLANT
SUT RELOAD ENDO STITCH 2 48X1 (ENDOMECHANICALS) ×6
SUT RELOAD ENDO STITCH 2.0 (ENDOMECHANICALS) ×4
SUT SURGIDAC NAB ES-9 0 48 120 (SUTURE) IMPLANT
SUT VIC AB 2-0 SH 27 (SUTURE) ×3
SUT VIC AB 2-0 SH 27X BRD (SUTURE) ×1 IMPLANT
SUTURE RELOAD END STTCH 2 48X1 (ENDOMECHANICALS) ×6 IMPLANT
SUTURE RELOAD ENDO STITCH 2.0 (ENDOMECHANICALS) ×4 IMPLANT
SYR 10ML ECCENTRIC (SYRINGE) ×3 IMPLANT
SYR 20ML LL LF (SYRINGE) ×6 IMPLANT
SYR 50ML LL SCALE MARK (SYRINGE) IMPLANT
TOWEL OR 17X26 10 PK STRL BLUE (TOWEL DISPOSABLE) ×3 IMPLANT
TRAY FOLEY MTR SLVR 16FR STAT (SET/KITS/TRAYS/PACK) ×3 IMPLANT
TROCAR ADV FIXATION 11X100MM (TROCAR) IMPLANT
TROCAR ADV FIXATION 12X100MM (TROCAR) ×3 IMPLANT
TROCAR ADV FIXATION 5X100MM (TROCAR) ×3 IMPLANT
TROCAR BLADELESS OPT 12M 100M (ENDOMECHANICALS) IMPLANT
TROCAR BLADELESS OPT 5 100 (ENDOMECHANICALS) IMPLANT
TROCAR XCEL 12X100 BLDLESS (ENDOMECHANICALS) ×2 IMPLANT
TUBING CONNECTING 10 (TUBING) ×1 IMPLANT
TUBING CONNECTING 10' (TUBING) ×1
TUBING ENDO SMARTCAP (MISCELLANEOUS) ×3 IMPLANT

## 2020-01-07 NOTE — Progress Notes (Signed)
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 female who underwent  Roux en Y Gastric Bypass on 01/07/20   Case start: 0718 Case end: 1035   No Known Allergies  Patient Measurements:   Body mass index is 44.62 kg/m.  Recent Labs    01/07/20 1057  HGB 13.2  HCT 41.8   Estimated Creatinine Clearance: 120.4 mL/min (by C-G formula based on SCr of 0.47 mg/dL).    Past Medical History:  Diagnosis Date  . AC globulin factor II (prothrombin) deficiency ([place])   . Anemia   . Blood dyscrasia    factor 11 mutation, referred to MD at [facility]  . Elevated ferritin level    just got referral to MD at [facility] today  . Fibroid   . Fibroids   . GERD (gastroesophageal reflux disease)    during pregnancy  . Hypertension    post delivery  . MTHFR deficiency complicating pregnancy, first trimester ([place])   . No pertinent past medical history   . PONV (postoperative nausea and vomiting)   . Prothrombin gene mutation ([place]) 07/06/2016   11/17? No documentation available  . Tinnitus   . UTI (lower urinary tract infection)      Medications Prior to Admission  Medication Sig Dispense Refill Last Dose  . Multiple Vitamin (MULTIVITAMIN WITH MINERALS) TABS tablet Take 1 tablet by mouth daily.    01/06/2020 at Unknown time  . calcium carbonate (TUMS EX) 750 MG chewable tablet Chew 1 tablet by mouth daily. (Patient not taking: Reported on 12/25/2019)   Not Taking at Unknown time  . folic acid (FOLVITE) 1 MG tablet TAKE 4 TABLETS BY MOUTH ONCE A DAY (Patient not taking: Reported on 12/25/2019)   Not Taking at Unknown time  . hydrochlorothiazide (HYDRODIURIL) 25 MG tablet Take 1 tablet (25 mg total) by mouth daily for 7 days. (Patient not taking: Reported on 12/25/2019) 7 tablet 0 Not Taking at Unknown time  . ibuprofen (ADVIL,MOTRIN) 600 MG tablet Take 1 tablet (600 mg total) by mouth every 6 (six) hours. (Patient not taking: Reported on 12/25/2019) 30 tablet 0 Not Taking at Unknown time  . labetalol (NORMODYNE) 100 MG tablet Take 1 tablet (100 mg total) by mouth 2 (two) times daily. (Patient not taking: Reported on 12/25/2019) 30 tablet 0 Not Taking at Unknown time  . Prenatal Vit-Fe Fumarate-FA (PRENATAL MULTIVITAMIN) TABS tablet Take 1 tablet by mouth daily at 12 noon. (Patient not taking: Reported on 12/25/2019)   Not Taking at Unknown time    Gretta Arab PharmD, [place] Pharmacist [facility] main pharmacy [phone] 01/07/2020 12:00 PM

## 2020-01-07 NOTE — Progress Notes (Signed)
Discussed post op day goals with patient including ambulation, IS, diet progression, pain, and nausea control.  BSTOP education provided including BSTOP information guide, "Guide for Pain Management after your Bariatric Procedure".  Questions answered. 

## 2020-01-07 NOTE — Op Note (Signed)
[name] [id number] Oct 23, 1980 01/07/2020  Preoperative diagnosis: roux en Y in progress by Dr Lucia Gaskins  Postoperative diagnosis: Same   Procedure: Upper endoscopy   Surgeon: Catalina Antigua B. Hassell Done  M.D., FACS   Anesthesia: Gen.   Indications for procedure: This patient was undergoing a roux en Y gastric bypass by Dr. Lucia Gaskins.   Description of procedure: The endoscopy was placed in the mouth and into the oropharynx and under endoscopic vision it was advanced to the esophagogastric junction.  The pouch was insufflated and it was about 6 cm in length.  .   No bleeding or leaks were detected.  The scope was withdrawn without difficulty.     Matt B. Hassell Done, MD, FACS General, Bariatric, & Minimally Invasive Surgery [facility] Surgery, [place]

## 2020-01-07 NOTE — Transfer of Care (Signed)
Immediate Anesthesia Transfer of Care Note  Patient: [name]  Procedure(s) Performed: LAPAROSCOPIC ROUX-EN-Y GASTRIC BYPASS WITH UPPER ENDOSCOPY (N/A )  Patient Location: PACU  Anesthesia Type:General  Level of Consciousness: awake, oriented, patient cooperative and responds to stimulation  Airway & Oxygen Therapy: Patient Spontanous Breathing and Patient connected to face mask oxygen  Post-op Assessment: Report given to RN and Post -op Vital signs reviewed and stable  Post vital signs: Reviewed and stable  Last Vitals:  Vitals Value Taken Time  BP 146/91 01/07/20 1046  Temp    Pulse 77 01/07/20 1048  Resp 20 01/07/20 1048  SpO2 96 % 01/07/20 1048  Vitals shown include unvalidated device data.  Last Pain:  Vitals:   01/07/20 0542  TempSrc: Oral         Complications: No complications documented.

## 2020-01-07 NOTE — Progress Notes (Signed)
Patient refusing to walk due to eye pain [name] called and will come assess when they get a chance.

## 2020-01-07 NOTE — Op Note (Signed)
PATIENT:   [name] DOB:   May 08, 1981 MRN:   [id number]  DATE OF PROCEDURE: 01/07/2020                   FACILITY:  [facility]  OPERATIVE REPORT  PREOPERATIVE DIAGNOSIS:  Morbid obesity.  POSTOPERATIVE DIAGNOSIS:  Morbid obesity (weight 261, BMI of 46.2).  PROCEDURE:  Laparoscopic Roux-en-Y gastric bypass, antecolic, antegastric (intraoperative upper endoscopy by Dr. Hassell Done)  SURGEON:  Fenton Malling. Lucia Gaskins, MD  FIRST ASSISTANT:  Rockne Coons, MD  ANESTHESIA:  General endotracheal.  Anesthesiologist: Lillia Abed, MD CRNA: Victoriano Lain, CRNA; Silas Sacramento, CRNA; West Pugh, CRNA  General  ESTIMATED BLOOD LOSS:  Minimal.  LOCAL ANESTHESIA:  30 cc of 1/4% Marcaine + 20 cc of Exparel  COMPLICATIONS:  None.  INDICATION FOR SURGERY:  [name] is a 39 y.o. white female who sees Tisovec, Fransico Him, MD as her primary care doctor.  She has completed our preoperative bariatric program and now comes for a laparoscopic Roux-en-Y gastric bypass.  The indications, potential complications of surgery were explained to the patient.  Potential complications of the surgery include, but are not limited to, bleeding, infection, DVT, open surgery, and long-term nutritional consequences.  OPERATIVE NOTE:  The patient taken to room #1 at [facility] where Ms. Murty underwent a general endotracheal anesthetic, supervised by Anesthesiologist: Lillia Abed, MD CRNA: Victoriano Lain, CRNA; Silas Sacramento, CRNA; West Pugh, CRNA.  The patient was given 2 g of cefotetan at the beginning of the procedure.  A time-out was held and surgical checklist run.  The abdomen was prepped with ChloraPrep and sterilely draped.  I accessed the abdominal cavity through the left upper quadrant using a 12 mm Optiview trocar.  I placed 6 additional trocars: 5 mm subxiphoid, 12 mm right subcostal, 12 mm right paramedian, 12 mm left paramedian, 5 mm lateral subcostal, and a 5 mm below to the right of the umbilicus.  I  placed a block along both abdominal side walls (20 cc per side) using a mixture of Exparel and Marcaine.  The abdomen was insufflated and abdominal exploration carried out.  Right and left lobes of liver unremarkable.  The stomach that I could see was unremarkable.  The patient had a moderate amount of greater omentum which draped over the bowel.  I was able to push the omentum and transverse colon up and identified the ligament of Treitz to start the operation.  I measured 40 cm of the jejunum, starting at the ligament of Tritz, and divided the jejunum with a white load of 45 mm Ethicon Endo-GIA stapler.  I divided a short length into the mesentery.  I measured 100 cm of jejunum for the future gastric limb.  I put a Penrose drain on the future gastric limb of the jejunum.  I then did a side-to-side jejunojejunostomy.   I used a 45 mm white load of the Ethicon Endo-GIA stapler for the anastomosis.  I closed the enterotomy with 2 running 2-0 Vicryl sutures.  I tested the JJ anastomosis with an alligator forceps and then covered this with Tisseel.  I closed the mesenteric defect with a running 2-0 silk suture with a Laparo-tye on each end.  I then divided the omentum with a Harmonic Scalpel.  I positioned the patient in reverse Trendelenburg and placed the liver retractor, which was introduced into the peritoneal cavity through a subxiphoid 5 mm trocar puncture, under the left lobe of the  liver.  I then identified the gastroesophageal junction.  I went to the left at the angle of His and made a window at the left side of the esophago-gastric junction for a target as my dissection.  I then went on the lesser curve of the stomach, measured 5 cm from the gastroesophageal junction down the lesser curve and dissected into the lesser sac from the lesser curvature side of the stomach.  I did the first firing of a 60 mm gold load Ethicon Eschelon stapler and then did 3 firings of the 60 mm blue load Ethicon  Eschelon stapler and one firing of the 45 mm blue load of the Ethicon stapler.  This created a gastric pouch approximately 5 cm in length and 3 cm in width.  There was no bleeding from either the pouch or the stomach remnant site.  I placed Tisseel on the pouch side along the new greater curvature.  I over sewed the gastric remnant with a locking 2-0 Vicryl suture with a Laparo-tye on each end..  I then brought the jejunum ante-colic, ante-gastric up to the new stomach pouch and placed a posterior running 2-0 Vicryl suture.  I then made an enterotomy into the stomach using the Ewald as a back stop and an enterotomy into the jejunum.  I did a stapled side-to-side gastrojejunal anastomosis using these two enterotomies with a 45 mm blue load of the Ethicon Endo GIA stapler.  I tried to create a 2.5 cm gastrojejunal anastomosis.  I closed the enterotomy with a 2 running 2-0 Vicryl sutures.  I passed the Ewald tube through the gastrojejunal anastomosis and then did an anterior Connell suture running of 2-0 Vicryl suture for the anterior layer of the gastrojejunostomy.  The Ewald tube was then removed without difficulty.  I then closed the Hamilton defect with a figure-of-eight 2-0 silk suture between the mesentery of the transverse colon and the mesentery of the distal jejunum.  Dr. Hassell Done then scrubbed out and did an intraoperative upper endoscopy.  He identified the esophagogastric junction about 38 cm, the gastrojejunal anastomosis about 44 cm.  I clamped off the small bowel.  He insufflated air and I flooded the abdomen with saline. There was no bubbling or evidence of air leak.  He then withdrew the scope and he will dictate that portion of the operation.    I then re-inspected the anastomoses, sucked out the saline, placed Tisseel over the stomach pouch and gastrojejunal anastomosis.   The liver retractor was removed.  The trocars were removed.  There was no bleeding at any trocar site.  I infiltrated  10 cc of the remaining local at the trocar sites.  The skin at each trocar site was closed with a 4-0 Monocryl suture.  After the skin incisions were closed with sutures they were painted with DermaBond.  The sponge and needle count were correct at the end of the case.  The patient tolerated the procedure well, was transported to the recovery room in good condition.   Alphonsa Overall, MD, [facility] Surgery Office phone:  [phone]

## 2020-01-07 NOTE — Anesthesia Procedure Notes (Signed)
Procedure Name: Intubation Date/Time: 01/07/2020 7:31 AM Performed by: Victoriano Lain, CRNA Pre-anesthesia Checklist: Patient identified, Emergency Drugs available, Suction available, Patient being monitored and Timeout performed Patient Re-evaluated:Patient Re-evaluated prior to induction Oxygen Delivery Method: Circle system utilized Preoxygenation: Pre-oxygenation with 100% oxygen Induction Type: IV induction Ventilation: Mask ventilation without difficulty and Oral airway inserted - appropriate to patient size Laryngoscope Size: Mac and 4 Grade View: Grade I Tube type: Oral Tube size: 7.5 mm Number of attempts: 1 Airway Equipment and Method: Stylet Placement Confirmation: ETT inserted through vocal cords under direct vision,  positive ETCO2 and breath sounds checked- equal and bilateral Secured at: 21 cm Tube secured with: Tape Dental Injury: Teeth and Oropharynx as per pre-operative assessment

## 2020-01-07 NOTE — Anesthesia Postprocedure Evaluation (Signed)
Anesthesia Post Note  Patient: [name]  Procedure(s) Performed: LAPAROSCOPIC ROUX-EN-Y GASTRIC BYPASS WITH UPPER ENDOSCOPY (N/A )     Patient location during evaluation: PACU Anesthesia Type: General Level of consciousness: awake and alert Pain management: pain level controlled Vital Signs Assessment: post-procedure vital signs reviewed and stable Respiratory status: spontaneous breathing, nonlabored ventilation, respiratory function stable and patient connected to nasal cannula oxygen Cardiovascular status: blood pressure returned to baseline and stable Postop Assessment: no apparent nausea or vomiting Anesthetic complications: no   No complications documented.  Last Vitals:  Vitals:   01/07/20 1434 01/07/20 1549  BP: (!) 141/87 139/78  Pulse: 60 67  Resp: 17 17  Temp: 36.8 C 36.9 C  SpO2: 98% 96%    Last Pain:  Vitals:   01/07/20 1230  TempSrc:   PainSc: [place] DAVID

## 2020-01-07 NOTE — Anesthesia Preprocedure Evaluation (Signed)
Anesthesia Evaluation  Patient identified by MRN, date of birth, ID band Patient awake    History of Anesthesia Complications (+) PONV  Airway Mallampati: I  TM Distance: >3 FB Neck ROM: Full    Dental   Pulmonary    Pulmonary exam normal        Cardiovascular hypertension, Pt. on medications Normal cardiovascular exam     Neuro/Psych    GI/Hepatic GERD  Medicated and Controlled,  Endo/Other    Renal/GU      Musculoskeletal   Abdominal   Peds  Hematology   Anesthesia Other Findings   Reproductive/Obstetrics                             Anesthesia Physical Anesthesia Plan  ASA: III  Anesthesia Plan: General   Post-op Pain Management:    Induction: Intravenous  PONV Risk Score and Plan: 4 or greater and Ondansetron, Midazolam, Scopolamine patch - Pre-op and Promethazine  Airway Management Planned: Oral ETT  Additional Equipment:   Intra-op Plan:   Post-operative Plan:   Informed Consent: I have reviewed the patients History and Physical, chart, labs and discussed the procedure including the risks, benefits and alternatives for the proposed anesthesia with the patient or authorized representative who has indicated his/her understanding and acceptance.       Plan Discussed with: Surgeon and CRNA  Anesthesia Plan Comments:         Anesthesia Quick Evaluation

## 2020-01-07 NOTE — Addendum Note (Signed)
Addendum  created 01/07/20 1832 by Nolon Nations, MD   Order list changed

## 2020-01-07 NOTE — Interval H&P Note (Signed)
History and Physical Interval Note:  01/07/2020 7:15 AM  [name]  has presented today for surgery, with the diagnosis of MORBID OPBESITY.  The various methods of treatment have been discussed with the patient and family.  Her husband is here with her.  After consideration of risks, benefits and other options for treatment, the patient has consented to  Procedure(s): LAPAROSCOPIC ROUX-EN-Y GASTRIC BYPASS WITH UPPER ENDOSCOPY (N/A) as a surgical intervention.  The patient's history has been reviewed, patient examined, no change in status, stable for surgery.  I have reviewed the patient's chart and labs.  Questions were answered to the patient's satisfaction.     Shann Medal

## 2020-01-07 NOTE — Progress Notes (Signed)
Patient has ambulated, voided, is using her incentive spirometer, and her vitals are stable.She started drinking her first 2oz cup of water at 1300.

## 2020-01-08 ENCOUNTER — Encounter (HOSPITAL_COMMUNITY): Payer: Self-pay | Admitting: Surgery

## 2020-01-08 LAB — CBC WITH DIFFERENTIAL/PLATELET
Abs Immature Granulocytes: 0.04 10*3/uL (ref 0.00–0.07)
Basophils Absolute: 0 10*3/uL (ref 0.0–0.1)
Basophils Relative: 0 %
Eosinophils Absolute: 0 10*3/uL (ref 0.0–0.5)
Eosinophils Relative: 0 %
HCT: 38.7 % (ref 36.0–46.0)
Hemoglobin: 12.2 g/dL (ref 12.0–15.0)
Immature Granulocytes: 0 %
Lymphocytes Relative: 14 %
Lymphs Abs: 1.6 10*3/uL (ref 0.7–4.0)
MCH: 26.8 pg (ref 26.0–34.0)
MCHC: 31.5 g/dL (ref 30.0–36.0)
MCV: 84.9 fL (ref 80.0–100.0)
Monocytes Absolute: 0.8 10*3/uL (ref 0.1–1.0)
Monocytes Relative: 7 %
Neutro Abs: 8.8 10*3/uL — ABNORMAL HIGH (ref 1.7–7.7)
Neutrophils Relative %: 79 %
Platelets: 261 10*3/uL (ref 150–400)
RBC: 4.56 MIL/uL (ref 3.87–5.11)
RDW: 15 % (ref 11.5–15.5)
WBC: 11.3 10*3/uL — ABNORMAL HIGH (ref 4.0–10.5)
nRBC: 0 % (ref 0.0–0.2)

## 2020-01-08 MED ORDER — ENOXAPARIN (LOVENOX) PATIENT EDUCATION KIT
PACK | Freq: Once | Status: AC
  Filled 2020-01-08: qty 1

## 2020-01-08 NOTE — Progress Notes (Signed)
Patient alert and oriented, Post op day 1.  Provided support and encouragement.  Encouraged pulmonary toilet, ambulation and small sips of liquids.  Completed 8 ounces of bari clear fluid. Lovenox teaching kit ordered and discussed with patient. Bedside RN aware of Lovenox at discharge. Will All questions answered.  Will continue to monitor.

## 2020-01-08 NOTE — Progress Notes (Signed)
Nutrition Brief Note  RD consulted for diet education for patient s/p bariatric surgery. Bariatric nurse coordinator providing education.   If nutrition issues arise, please consult RD.   Priseis Cratty, MS, RD, LDN Inpatient Clinical Dietitian Contact information available via Amion  

## 2020-01-08 NOTE — Progress Notes (Signed)
Patient has met goals and has started drinking first 2oz cup of protein.  

## 2020-01-08 NOTE — Progress Notes (Signed)
[facility] Office:  [phone] General Surgery Progress Note   LOS: 1 day  POD -  1 Day Post-Op  Assessment and Plan: 1.  LAPAROSCOPIC ROUX-EN-Y GASTRIC BYPASS WITH UPPER ENDOSCOPY - 01/07/2020 - Nitesh Pitstick  Still on water, to start protein drinks  2.  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 Had asthma with exercise 4.  DVT prophylaxis - Lovenox  Pharmacy recommending 4 weeks of BID Lovenox   Active Problems:   Morbid obesity (HCC)  Subjective:  Doing well.  She woke up yesterday with burning both eyes.  That is better today.  Husband in room with the patient.  Objective:   Vitals:   01/08/20 0140 01/08/20 0649  BP: 125/65 135/87  Pulse: 73 64  Resp: 16 18  Temp: 98.4 F (36.9 C) 98 F (36.7 C)  SpO2: 94% 96%     Intake/Output from previous day:  08/09 0701 - 08/10 0700 In: 2469.7 [P.O.:180; I.V.:2189.7; IV Piggyback:100] Out: 2270 [Urine:2250; Blood:20]  Intake/Output this shift:  No intake/output data recorded.   Physical Exam:   General: Obese WF who is alert and oriented.    HEENT: Normal. Pupils equal.  There is no redness to her eyes. .   Lungs: Clear   Abdomen: Soft   Wound: Clean     Lab Results:    Recent Labs    01/07/20 1057 01/08/20 0434  WBC  --  11.3*  HGB 13.2 12.2  HCT 41.8 38.7  PLT  --  261    BMET  No results for input(s): NA, K, CL, CO2, GLUCOSE, BUN, CREATININE, CALCIUM in the last 72 hours.  PT/INR  No results for input(s): LABPROT, INR in the last 72 hours.  ABG  No results for input(s): PHART, HCO3 in the last 72 hours.  Invalid input(s): PCO2, PO2   Studies/Results:  No results found.   Anti-infectives:   Anti-infectives (From admission, onward)   Start     Dose/Rate Route Frequency Ordered Stop   01/07/20 0600  cefoTEtan (CEFOTAN) 2 g in sodium chloride 0.9 % 100 mL IVPB        2 g 200 mL/hr over 30 Minutes Intravenous On call to O.R. 01/07/20 0536 01/07/20 0803   01/07/20 0542  sodium chloride 0.9 % with  cefoTEtan (CEFOTAN) ADS Med       Note to Pharmacy: Charmayne Sheer   : cabinet override      01/07/20 0542 01/07/20 [place], MD, [facility] Surgery Office: [phone] 01/08/2020

## 2020-01-09 LAB — CBC WITH DIFFERENTIAL/PLATELET
Abs Immature Granulocytes: 0.02 10*3/uL (ref 0.00–0.07)
Basophils Absolute: 0.1 10*3/uL (ref 0.0–0.1)
Basophils Relative: 1 %
Eosinophils Absolute: 0.2 10*3/uL (ref 0.0–0.5)
Eosinophils Relative: 2 %
HCT: 39.3 % (ref 36.0–46.0)
Hemoglobin: 12.2 g/dL (ref 12.0–15.0)
Immature Granulocytes: 0 %
Lymphocytes Relative: 28 %
Lymphs Abs: 2.3 10*3/uL (ref 0.7–4.0)
MCH: 26.3 pg (ref 26.0–34.0)
MCHC: 31 g/dL (ref 30.0–36.0)
MCV: 84.9 fL (ref 80.0–100.0)
Monocytes Absolute: 0.7 10*3/uL (ref 0.1–1.0)
Monocytes Relative: 9 %
Neutro Abs: 4.9 10*3/uL (ref 1.7–7.7)
Neutrophils Relative %: 60 %
Platelets: 239 10*3/uL (ref 150–400)
RBC: 4.63 MIL/uL (ref 3.87–5.11)
RDW: 15.4 % (ref 11.5–15.5)
WBC: 8.2 10*3/uL (ref 4.0–10.5)
nRBC: 0 % (ref 0.0–0.2)

## 2020-01-09 MED ORDER — ENOXAPARIN SODIUM 30 MG/0.3ML ~~LOC~~ SOLN: 40.0000 mg | Freq: Two times a day (BID) | SUBCUTANEOUS | 0 refills | Status: AC

## 2020-01-09 MED ORDER — TRAMADOL HCL 50 MG PO TABS
50.0000 mg | ORAL_TABLET | Freq: Four times a day (QID) | ORAL | 0 refills | Status: DC | PRN
Start: 2020-01-09 — End: 2022-04-15

## 2020-01-09 NOTE — Progress Notes (Signed)
Patient educated on Lovenox administration and patient successfully administered injection herself.

## 2020-01-09 NOTE — Discharge Summary (Signed)
Physician Discharge Summary  Patient ID:  [name]  MRN: [id number]  DOB/AGE: 01-Apr-1981 39 y.o.  Admit date: 01/07/2020 Discharge date: 01/09/2020  Discharge Diagnoses:   Morbid obesity Prothrombin mutation   Active Problems:   Morbid obesity ([place])  Operation: Procedure(s): LAPAROSCOPIC ROUX-EN-Y GASTRIC BYPASS WITH UPPER ENDOSCOPY on 01/07/2020 Lucia Gaskins  Discharged Condition: good  Hospital Course: [name] is an 39 y.o. female whose primary care physician is Tisovec, Fransico Him, MD and who was admitted 01/07/2020 with a chief complaint of morbid obesity.   She was brought to the operating room on 01/07/2020 and underwent  LAPAROSCOPIC ROUX-EN-Y GASTRIC BYPASS WITH UPPER ENDOSCOPY.  She had some burning in her eyes the first day of surgery.  This has cleared up. She is now two days post op and she is doing well.   The discharge instructions were reviewed with the patient.  Consults: None  Significant Diagnostic Studies: Results for orders placed or performed during the hospital encounter of 01/07/20  Pregnancy, urine STAT morning of surgery  Result Value Ref Range   Preg Test, Ur NEGATIVE NEGATIVE  Hemoglobin and hematocrit, blood  Result Value Ref Range   Hemoglobin 13.2 12.0 - 15.0 g/dL   HCT 41.8 36 - 46 %  CBC WITH DIFFERENTIAL  Result Value Ref Range   WBC 11.3 (H) 4.0 - 10.5 K/uL   RBC 4.56 3.87 - 5.11 MIL/uL   Hemoglobin 12.2 12.0 - 15.0 g/dL   HCT 38.7 36 - 46 %   MCV 84.9 80.0 - 100.0 fL   MCH 26.8 26.0 - 34.0 pg   MCHC 31.5 30.0 - 36.0 g/dL   RDW 15.0 11.5 - 15.5 %   Platelets 261 150 - 400 K/uL   nRBC 0.0 0.0 - 0.2 %   Neutrophils Relative % 79 %   Neutro Abs 8.8 (H) 1.7 - 7.7 K/uL   Lymphocytes Relative 14 %   Lymphs Abs 1.6 0.7 - 4.0 K/uL   Monocytes Relative 7 %   Monocytes Absolute 0.8 0 - 1 K/uL   Eosinophils Relative 0 %   Eosinophils Absolute 0.0 0 - 0 K/uL   Basophils Relative 0 %   Basophils Absolute 0.0 0 - 0 K/uL   Immature  Granulocytes 0 %   Abs Immature Granulocytes 0.04 0.00 - 0.07 K/uL  CBC with Differential  Result Value Ref Range   WBC 8.2 4.0 - 10.5 K/uL   RBC 4.63 3.87 - 5.11 MIL/uL   Hemoglobin 12.2 12.0 - 15.0 g/dL   HCT 39.3 36 - 46 %   MCV 84.9 80.0 - 100.0 fL   MCH 26.3 26.0 - 34.0 pg   MCHC 31.0 30.0 - 36.0 g/dL   RDW 15.4 11.5 - 15.5 %   Platelets 239 150 - 400 K/uL   nRBC 0.0 0.0 - 0.2 %   Neutrophils Relative % 60 %   Neutro Abs 4.9 1.7 - 7.7 K/uL   Lymphocytes Relative 28 %   Lymphs Abs 2.3 0.7 - 4.0 K/uL   Monocytes Relative 9 %   Monocytes Absolute 0.7 0 - 1 K/uL   Eosinophils Relative 2 %   Eosinophils Absolute 0.2 0 - 0 K/uL   Basophils Relative 1 %   Basophils Absolute 0.1 0 - 0 K/uL   Immature Granulocytes 0 %   Abs Immature Granulocytes 0.02 0.00 - 0.07 K/uL  Type and screen  Result Value Ref Range   ABO/RH(D) O POS  Antibody Screen NEG    Sample Expiration      01/10/2020,2359 Performed at [facility], [place] [address], [place], [place] [address]     No results found.  Discharge Exam:  Vitals:   01/09/20 0611 01/09/20 0929  BP: (!) 145/85 (!) 153/89  Pulse: 67 66  Resp: 18 18  Temp: 97.6 F (36.4 C) 97.7 F (36.5 C)  SpO2: 97% 97%    General: Obese WF who is alert and generally healthy appearing.  Lungs: Clear to auscultation and symmetric breath sounds.  She pulls 1,200 cc on IS. Heart:  RRR. No murmur or rub. Abdomen: Soft. No mass. No hernia.   Her incisions look good.  Discharge Medications:   Allergies as of 01/09/2020   No Known Allergies      Medication List     STOP taking these medications    ibuprofen 600 MG tablet Commonly known as: ADVIL       TAKE these medications    calcium carbonate 750 MG chewable tablet Commonly known as: TUMS EX Chew 1 tablet by mouth daily.   enoxaparin 30 MG/0.3ML injection Commonly known as: LOVENOX Inject 0.4 mLs (40 mg total) into the skin every 12 (twelve) hours.    folic acid 1 MG tablet Commonly known as: FOLVITE TAKE 4 TABLETS BY MOUTH ONCE A DAY   hydrochlorothiazide 25 MG tablet Commonly known as: HYDRODIURIL Take 1 tablet (25 mg total) by mouth daily for 7 days. Notes to patient: Monitor Blood Pressure Daily and keep a log for primary care physician.  Monitor for symptoms of dehydration.  You may need to make changes to your medications with rapid weight loss.     labetalol 100 MG tablet Commonly known as: NORMODYNE Take 1 tablet (100 mg total) by mouth 2 (two) times daily. Notes to patient: Monitor Blood Pressure Daily and keep a log for primary care physician.  You may need to make changes to your medications with rapid weight loss.     multivitamin with minerals Tabs tablet Take 1 tablet by mouth daily.   prenatal multivitamin Tabs tablet Take 1 tablet by mouth daily at 12 noon.   traMADol 50 MG tablet Commonly known as: ULTRAM Take 1 tablet (50 mg total) by mouth every 6 (six) hours as needed (pain).        Disposition: Discharge disposition: 01-Home or Self Care       Discharge Instructions     Ambulate hourly while awake   Complete by: As directed    Call MD for:  difficulty breathing, headache or visual disturbances   Complete by: As directed    Call MD for:  persistant dizziness or light-headedness   Complete by: As directed    Call MD for:  persistant nausea and vomiting   Complete by: As directed    Call MD for:  redness, tenderness, or signs of infection (pain, swelling, redness, odor or green/yellow discharge around incision site)   Complete by: As directed    Call MD for:  severe uncontrolled pain   Complete by: As directed    Call MD for:  temperature >101 F   Complete by: As directed    Diet bariatric full liquid   Complete by: As directed         Follow-up Information     Alphonsa Overall, MD. Go on 01/31/2020.   Specialty: General Surgery Why: at 930 am.  Please arrive 15 minutes prior to  appointment.  Thank you Contact information: [address] [place] [address] [phone]         Carlena Hurl, PA-C. Go on 02/29/2020.   Specialty: General Surgery Why: at 930 am.  Please arrive 15 minutes prior to appointment.  Thank you Contact information: [place] [address] [phone]                  Signed: Alphonsa Overall, M.D., [facility] Surgery Office:  [phone]  01/09/2020, 10:16 AM

## 2020-01-09 NOTE — Progress Notes (Signed)
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  Recent Labs    01/07/20 1057 01/08/20 0434 01/09/20 0455  WBC  --  11.3* 8.2  HGB 13.2 12.2 12.2  HCT 41.8 38.7 39.3  PLT  --  261 239   Estimated Creatinine Clearance: 120.4 mL/min (by C-G formula based on SCr of 0.47 mg/dL).    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 No documentation available    Tinnitus    UTI (lower urinary tract infection)      Medications Prior to Admission  Medication Sig Dispense Refill Last Dose   Multiple Vitamin (MULTIVITAMIN WITH MINERALS) TABS tablet Take 1 tablet by mouth daily.   01/06/2020 at Unknown time   calcium carbonate (TUMS EX) 750 MG chewable tablet Chew 1 tablet by mouth daily. (Patient not taking: Reported on 12/25/2019)   Not Taking at Unknown time   folic acid (FOLVITE) 1 MG tablet TAKE 4 TABLETS BY MOUTH ONCE A DAY (Patient not taking: Reported on 12/25/2019)   Not Taking at Unknown time   hydrochlorothiazide (HYDRODIURIL) 25 MG tablet Take 1 tablet (25 mg total) by mouth daily for 7 days. (Patient not taking: Reported on 12/25/2019) 7 tablet 0 Not Taking at Unknown time   ibuprofen (ADVIL,MOTRIN) 600 MG tablet Take 1 tablet (600 mg total) by mouth every 6 (six) hours. (Patient not taking: Reported on 12/25/2019) 30 tablet 0 Not Taking at Unknown time   labetalol (NORMODYNE) 100 MG tablet Take 1 tablet (100 mg total) by mouth 2 (two) times daily. (Patient not taking: Reported on 12/25/2019) 30 tablet 0 Not Taking at Unknown time   Prenatal Vit-Fe Fumarate-FA (PRENATAL MULTIVITAMIN) TABS tablet Take 1 tablet by mouth daily at 12  noon. (Patient not taking: Reported on 12/25/2019)   Not Taking at Unknown time    Lenis Noon, PharmD 01/09/20 10:24 AM

## 2020-01-09 NOTE — Progress Notes (Signed)
Patient alert and oriented, pain is controlled. Patient is tolerating fluids, advanced to protein shake today, patient is tolerating well. Reviewed Gastric Bypass discharge instructions with patient and patient is able to articulate understanding. Provided information on BELT program, Support Group, Lovenox and [facility] outpatient pharmacy. All questions answered, will continue to monitor.

## 2020-01-09 NOTE — Discharge Instructions (Signed)
Enoxaparin injection What is this medicine? ENOXAPARIN (ee nox a PA rin) is used after knee, hip, or abdominal surgeries to prevent blood clotting. It is also used to treat existing blood clots in the lungs or in the veins. This medicine may be used for other purposes; ask your health care provider or pharmacist if you have questions. COMMON BRAND NAME(S): Lovenox What should I tell my health care provider before I take this medicine? They need to know if you have any of these conditions:  bleeding disorders, hemorrhage, or hemophilia  infection of the heart or heart valves  kidney or liver disease  previous stroke  prosthetic heart valve  recent surgery or delivery of a baby  ulcer in the stomach or intestine, diverticulitis, or other bowel disease  an unusual or allergic reaction to enoxaparin, heparin, pork or pork products, other medicines, foods, dyes, or preservatives  pregnant or trying to get pregnant  breast-feeding How should I use this medicine? This medicine is for injection under the skin. It is usually given by a health-care professional. You or a family member may be trained on how to give the injections. If you are to give yourself injections, make sure you understand how to use the syringe, measure the dose if necessary, and give the injection. To avoid bruising, do not rub the site where this medicine has been injected. Do not take your medicine more often than directed. Do not stop taking except on the advice of your doctor or health care professional. Make sure you receive a puncture-resistant container to dispose of the needles and syringes once you have finished with them. Do not reuse these items. Return the container to your doctor or health care professional for proper disposal. Talk to your pediatrician regarding the use of this medicine in children. Special care may be needed. Overdosage: If you think you have taken too much of this medicine contact a poison  control center or emergency room at once. NOTE: This medicine is only for you. Do not share this medicine with others. What if I miss a dose? If you miss a dose, take it as soon as you can. If it is almost time for your next dose, take only that dose. Do not take double or extra doses. What may interact with this medicine?  aspirin and aspirin-like medicines  certain medicines that treat or prevent blood clots  dipyridamole  NSAIDs, medicines for pain and inflammation, like ibuprofen or naproxen This list may not describe all possible interactions. Give your health care provider a list of all the medicines, herbs, non-prescription drugs, or dietary supplements you use. Also tell them if you smoke, drink alcohol, or use illegal drugs. Some items may interact with your medicine. What should I watch for while using this medicine? Visit your healthcare professional for regular checks on your progress. You may need blood work done while you are taking this medicine. Your condition will be monitored carefully while you are receiving this medicine. It is important not to miss any appointments. If you are going to need surgery or other procedure, tell your healthcare professional that you are using this medicine. Using this medicine for a long time may weaken your bones and increase the risk of bone fractures. Avoid sports and activities that might cause injury while you are using this medicine. Severe falls or injuries can cause unseen bleeding. Be careful when using sharp tools or knives. Consider using an Copy. Take special care brushing or flossing your  teeth. Report any injuries, bruising, or red spots on the skin to your healthcare professional. Wear a medical ID bracelet or chain. Carry a card that describes your disease and details of your medicine and dosage times. What side effects may I notice from receiving this medicine? Side effects that you should report to your doctor or health  care professional as soon as possible:  allergic reactions like skin rash, itching or hives, swelling of the face, lips, or tongue  bone pain  signs and symptoms of bleeding such as bloody or black, tarry stools; red or dark-brown urine; spitting up blood or brown material that looks like coffee grounds; red spots on the skin; unusual bruising or bleeding from the eye, gums, or nose  signs and symptoms of a blood clot such as chest pain; shortness of breath; pain, swelling, or warmth in the leg  signs and symptoms of a stroke such as changes in vision; confusion; trouble speaking or understanding; severe headaches; sudden numbness or weakness of the face, arm or leg; trouble walking; dizziness; loss of coordination Side effects that usually do not require medical attention (report to your doctor or health care professional if they continue or are bothersome):  hair loss  pain, redness, or irritation at site where injected This list may not describe all possible side effects. Call your doctor for medical advice about side effects. You may report side effects to FDA at 1-800-FDA-1088. Where should I keep my medicine? Keep out of the reach of children. Store at room temperature between 15 and 30 degrees C (59 and 86 degrees F). Do not freeze. If your injections have been specially prepared, you may need to store them in the refrigerator. Ask your pharmacist. Throw away any unused medicine after the expiration date. NOTE: This sheet is a summary. It may not cover all possible information. If you have questions about this medicine, talk to your doctor, pharmacist, or health care provider.  2020 Elsevier/Gold Standard (2017-05-12 11:25:34)     GASTRIC BYPASS/SLEEVE  Home Care Instructions   These instructions are to help you care for yourself when you go home.  Call: If you have any problems. . Call [phone] and ask for the surgeon on call . If you need immediate help, come to the ER  at [facility].  . Tell the ER staff that you are a new post-op gastric bypass or gastric sleeve patient   Signs and symptoms to report: . Severe vomiting or nausea o If you cannot keep down clear liquids for longer than 1 day, call your surgeon  . Abdominal pain that does not get better after taking your pain medication . Fever over 100.4 F with chills . Heart beating over 100 beats a minute . Shortness of breath at rest . Chest pain .  Redness, swelling, drainage, or foul odor at incision (surgical) sites .  If your incisions open or pull apart . Swelling or pain in calf (lower leg) . Diarrhea (Loose bowel movements that happen often), frequent watery, uncontrolled bowel movements . Constipation, (no bowel movements for 3 days) if this happens: Pick one o Milk of Magnesia, 2 tablespoons by mouth, 3 times a day for 2 days if needed o Stop taking Milk of Magnesia once you have a bowel movement o Call your doctor if constipation continues Or o Miralax  (instead of Milk of Magnesia) following the label instructions o Stop taking Miralax once you have a bowel movement o Call your doctor if constipation  continues . Anything you think is not normal   Normal side effects after surgery: . Unable to sleep at night or unable to focus . Irritability or moody . Being tearful (crying) or depressed These are common complaints, possibly related to your anesthesia medications that put you to sleep, stress of surgery, and change in lifestyle.  This usually goes away a few weeks after surgery.  If these feelings continue, call your primary care doctor.   Wound Care: You may have surgical glue, steri-strips, or staples over your incisions after surgery . Surgical glue:  Looks like a clear film over your incisions and will wear off a little at a time . Steri-strips: Strips of tape over your incisions. You may notice a yellowish color on the skin under the steri-strips. This is used to make the    steri-strips stick better. Do not pull the steri-strips off - let them fall off . Staples: Jodell Cipro may be removed before you leave the hospital o If you go home with staples, call [facility], [phone]) [phone] at for an appointment with your surgeon's nurse to have staples removed 10 days after surgery. . Showering: You may shower two (2) days after your surgery unless your surgeon tells you differently o Wash gently around incisions with warm soapy water, rinse well, and gently pat dry  o No tub baths until staples are removed, steri-strips fall off or glue is gone.    Medications: Marland Kitchen Medications should be liquid or crushed if larger than the size of a dime . Extended release pills (medication that release a little bit at a time through the day) should NOT be crushed or cut. (examples include XL, ER, DR, SR) . Depending on the size and number of medications you take, you may need to space (take a few throughout the day)/change the time you take your medications so that you do not over-fill your pouch (smaller stomach) . Make sure you follow-up with your primary care doctor to make medication changes needed during rapid weight loss and life-style changes . If you have diabetes, follow up with the doctor that orders your diabetes medication(s) within one week after surgery and check your blood sugar regularly. . Do not drive while taking prescription pain medication  . It is ok to take Tylenol by the bottle instructions with your pain medicine or instead of your pain medicine as needed.  DO NOT TAKE NSAIDS (EXAMPLES OF NSAIDS:  IBUPROFREN/ NAPROXEN)  Diet:                    First 2 Weeks  You will see the dietician t about two (2) weeks after your surgery. The dietician will increase the types of foods you can eat if you are handling liquids well: Marland Kitchen If you have severe vomiting or nausea and cannot keep down clear liquids lasting longer than 1 day, call your surgeon @ [phone]) Protein  Shake . Drink at least 2 ounces of shake 5-6 times per day . Each serving of protein shakes (usually 8 - 12 ounces) should have: o 15 grams of protein  o And no more than 5 grams of carbohydrate  . Goal for protein each day: o Men = 80 grams per day o Women = 60 grams per day . Protein powder may be added to fluids such as non-fat milk or Lactaid milk or unsweetened Soy/Almond milk (limit to 35 grams added protein powder per serving)  Hydration . Slowly increase the  amount of water and other clear liquids as tolerated (See Acceptable Fluids) . Slowly increase the amount of protein shake as tolerated  .  Sip fluids slowly and throughout the day.  Do not use straws. . May use sugar substitutes in small amounts (no more than 6 - 8 packets per day; i.e. Splenda)  Fluid Goal . The first goal is to drink at least 8 ounces of protein shake/drink per day (or as directed by the nutritionist); some examples of protein shakes are [employer], [employer], EAS Edge HP, and Unjury. See handout from pre-op Bariatric Education Class: o Slowly increase the amount of protein shake you drink as tolerated o You may find it easier to slowly sip shakes throughout the day o It is important to get your proteins in first . Your fluid goal is to drink 64 - 100 ounces of fluid daily o It may take a few weeks to build up to this . 32 oz (or more) should be clear liquids  And  . 32 oz (or more) should be full liquids (see below for examples) . Liquids should not contain sugar, caffeine, or carbonation  Clear Liquids: . Water or Sugar-free flavored water (i.e. Fruit H2O, Propel) . Decaffeinated coffee or tea (sugar-free) . [employer], [employer], Minute [facility] . Sugar-free Jell-O . Bouillon or broth . Sugar-free Popsicle:   *Less than 20 calories each; Limit 1 per day  Full Liquids: Protein Shakes/Drinks + 2 choices per day of other full liquids . Full liquids must be: o No More Than 15  grams of Carbs per serving  o No More Than 3 grams of Fat per serving . Strained low-fat cream soup (except Cream of Potato or Tomato) . Non-Fat milk . Fat-free Lactaid Milk . Unsweetened Soy Or Unsweetened Almond Milk . Low Sugar yogurt (Dannon Lite & Fit, [place] yogurt; Oikos Triple Zero; Chobani Simply 100; Yoplait 100 calorie [place] - No Fruit on the Bottom)    Vitamins and Minerals . Start 1 day after surgery unless otherwise directed by your surgeon . Chewable Bariatric Specific Multivitamin / Multimineral Supplement with iron (Example: Bariatric Advantage Multi EA) . Chewable Calcium with Vitamin D-3 (Example: 3 Chewable Calcium Plus 600 with Vitamin D-3) o Take 500 mg three (3) times a day for a total of 1500 mg each day o Do not take all 3 doses of calcium at one time as it may cause constipation, and you can only absorb 500 mg  at a time  o Do not mix multivitamins containing iron with calcium supplements; take 2 hours apart . Menstruating women and those with a history of anemia (a blood disease that causes weakness) may need extra iron o Talk with your doctor to see if you need more iron . Do not stop taking or change any vitamins or minerals until you talk to your dietitian or surgeon . Your Dietitian and/or surgeon must approve all vitamin and mineral supplements   Activity and Exercise: Limit your physical activity as instructed by your doctor.  It is important to continue walking at home.  During this time, use these guidelines: . Do not lift anything greater than ten (10) pounds for at least two (2) weeks . Do not go back to work or drive until Engineer, production says you can . You may have sex when you feel comfortable  o It is VERY important for female patients to use a reliable birth control method; fertility often increases after surgery  o All hormonal birth control will be ineffective for 30 days after surgery due to medications given during surgery a barrier method must be  used. o Do not get pregnant for at least 18 months . Start exercising as soon as your doctor tells you that you can o Make sure your doctor approves any physical activity . Start with a simple walking program . Walk 5-15 minutes each day, 7 days per week.  . Slowly increase until you are walking 30-45 minutes per day Consider joining our [place] program. [phone] or email [email or web address]   Special Instructions Things to remember: . Use your CPAP when sleeping if this applies to you  . [facility] has two free Bariatric Surgery Support Groups that meet monthly o The 3rd Thursday of each month, 6 pm o The 2nd Friday of each month at 1130 am . It is very important to keep all follow up appointments with your surgeon, dietitian, primary care physician, and behavioral health practitioner . Routine follow up schedule with your surgeon include appointments at 2-3 weeks, 6-8 weeks, 6 months, and 1 year at a minimum.  Your surgeon may request to see you more often.   . After the first year, please follow up with your bariatric surgeon and dietitian at least once a year in order to maintain best weight loss results   [facility]: [place]: [phone] Bariatric Nurse Coordinator: [phone]      Reviewed and Endorsed  by [facility] Patient Education Committee, June, 2016 Edits Approved: Aug, 2018

## 2020-01-14 ENCOUNTER — Telehealth (HOSPITAL_COMMUNITY): Payer: Self-pay

## 2020-01-14 NOTE — Telephone Encounter (Signed)
Patient called to discuss post bariatric surgery follow up questions.  See below:   1.  Tell me about your pain and pain management?denies pain  2.  Let's talk about fluid intake.  How much total fluid are you taking in?50 ounces of fluids  3.  How much protein have you taken in the last 2 days?45-60 grams  4.  Have you had nausea?  Tell me about when have experienced nausea and what you did to help? denies nausea  5.  Has the frequency or color changed with your urine? yellow in color   6.  Tell me what your incisions look like?rash on abdomen and torso itchy taking bendaryl and hydrocortisone cream, appointment in office  7.  Have you been passing gas? BM?had bm since discharge, no laxatives needed  8.  If a problem or question were to arise who would you call?  Do you know contact numbers for [place], [facility], and [facility]?aware of how to contact all services  9.  How has the walking going?walking without difficulty  10.  How are your vitamins and calcium going?  How are you taking them?mvi and calcium started   Taking Lovenox without difficulty

## 2020-01-22 ENCOUNTER — Other Ambulatory Visit: Payer: Self-pay

## 2020-01-22 ENCOUNTER — Encounter: Payer: BC Managed Care – PPO | Attending: Surgery | Admitting: Skilled Nursing Facility1

## 2020-01-22 DIAGNOSIS — E669 Obesity, unspecified: Secondary | ICD-10-CM | POA: Diagnosis not present

## 2020-01-22 NOTE — Progress Notes (Addendum)
2 Week Post-Operative Nutrition Class   Patient was seen on 07/25/18 for Post-Operative Nutrition education at [facility].    Pt arrives scratching a rash that has spread from her abdomen to her arms.   Surgery date: 01/07/2020 Surgery type: RYGB Start weight at [facility]: 270.9 Weight today: 250.7  The following the learning objectives were met by the patient during this course:  Identifies Phase 3 (Soft, High Proteins) Dietary Goals and will begin from 2 weeks post-operatively to 2 months post-operatively  Identifies appropriate sources of fluids and proteins   States protein recommendations and appropriate sources post-operatively  Identifies the need for appropriate texture modifications, mastication, and bite sizes when consuming solids  Identifies appropriate multivitamin and calcium sources post-operatively  Describes the need for physical activity post-operatively and will follow MD recommendations  States when to call healthcare provider regarding medication questions or post-operative complications   Handouts given during class include:  Phase 3A: Soft, High Protein Diet Handout   Follow-Up Plan: Patient will follow-up at [facility] in 6 weeks for 2 month post-op nutrition visit for diet advancement per MD.

## 2020-01-28 ENCOUNTER — Telehealth: Payer: Self-pay | Admitting: Skilled Nursing Facility1

## 2020-01-28 NOTE — Telephone Encounter (Signed)
RD called pt to verify fluid intake once starting soft, solid proteins 2 week post-bariatric surgery.   Daily Fluid intake: 30-40 Daily Protein intake: 60+  Concerns/issues:   Pt states she is working on increasing her fluid intake with no functional issue getting in the way. Dietitian advised to be aware of dry mouth, dark urine, fatigue, and dizziness. Pt states she is not exp[eriencing those symptoms other than dry mouth and decreased urine output.

## 2020-02-10 ENCOUNTER — Ambulatory Visit
Admission: EM | Admit: 2020-02-10 | Discharge: 2020-02-10 | Disposition: A | Discharge status: Home / Self Care | Payer: BC Managed Care – PPO

## 2020-02-10 ENCOUNTER — Other Ambulatory Visit: Payer: Self-pay

## 2020-02-10 ENCOUNTER — Encounter: Payer: Self-pay | Admitting: Emergency Medicine

## 2020-02-10 DIAGNOSIS — J069 Acute upper respiratory infection, unspecified: Secondary | ICD-10-CM | POA: Diagnosis not present

## 2020-02-10 DIAGNOSIS — Z1152 Encounter for screening for COVID-19: Secondary | ICD-10-CM | POA: Diagnosis not present

## 2020-02-10 MED ORDER — FLUTICASONE PROPIONATE 50 MCG/ACT NA SUSP
1.0000 | Freq: Every day | NASAL | 0 refills | Status: DC
Start: 2020-02-10 — End: 2022-04-15

## 2020-02-10 MED ORDER — ALBUTEROL SULFATE HFA 108 (90 BASE) MCG/ACT IN AERS: 2.0000 | INHALATION_SPRAY | Freq: Four times a day (QID) | RESPIRATORY_TRACT | 0 refills | Status: DC | PRN

## 2020-02-10 MED ORDER — METHYLPREDNISOLONE SODIUM SUCC 125 MG IJ SOLR
125.0000 mg | Freq: Once | INTRAMUSCULAR | Status: AC
  Administered 2020-02-10: 125 mg via INTRAMUSCULAR

## 2020-02-10 MED ORDER — BENZONATATE 100 MG PO CAPS
100.0000 mg | ORAL_CAPSULE | Freq: Three times a day (TID) | ORAL | 0 refills | Status: DC
Start: 2020-02-10 — End: 2022-04-15

## 2020-02-10 MED ORDER — CETIRIZINE HCL 10 MG PO TABS
10.0000 mg | ORAL_TABLET | Freq: Every day | ORAL | 0 refills | Status: DC
Start: 2020-02-10 — End: 2022-04-15

## 2020-02-10 NOTE — ED Provider Notes (Signed)
[facility]    CSN: [id number] Arrival date & time: 02/10/20  1400      History   Chief Complaint Chief Complaint  Patient presents with  . URI  . Cough    HPI [name] is a 39 y.o. female  Presenting for URI and fever x4 days.  Patient underwent rapid home Covid testing on day 1 of symptoms: Negative.  Has had worsening wheezing since.  No chest pain, shortness of breath, vomiting or diarrhea.  Using home albuterol with some relief.  Past Medical History:  Diagnosis Date  . 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 Anemia   . Blood dyscrasia    factor 11 mutation, referred to MD at [facility]  . Elevated ferritin level    just got referral to MD at [facility] today  . Fibroid   . Fibroids   . GERD (gastroesophageal reflux disease)    during pregnancy  . Hypertension    post delivery  . MTHFR deficiency complicating pregnancy, first trimester ([place])   . No pertinent past medical history   . PONV (postoperative nausea and vomiting)   . Prothrombin gene mutation ([place]) 07/06/2016   11/17? No documentation available  . Tinnitus   . UTI (lower urinary tract infection)     Patient Active Problem List   Diagnosis Date Noted  . Morbid obesity ([place]) 01/07/2020  . PP VAVD (8/28) 01/26/2018  . Encounter for planned induction of labor 01/25/2018  . Prothrombin gene mutation ([place]) 07/06/2016  . Pregnancy 04/10/2016  . Obesity 12/07/2013  . Reactive airway disease 12/07/2013    Past Surgical History:  Procedure Laterality Date  . breast cyst removed    . DILATATION & CURETTAGE/HYSTEROSCOPY WITH MYOSURE N/A 06/24/2016   Procedure: DILATATION & CURETTAGE/HYSTEROSCOPY WITH  MYOSURE;  Surgeon: Brien Few, MD;  Location: [facility];  Service: Gynecology;  Laterality: N/A;  . DILATION AND EVACUATION N/A 04/10/2016   Procedure: DILATATION AND EVACUATION;  Surgeon: Brien Few, MD;  Location: [facility];  Service: Gynecology;  Laterality: N/A;  . 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 ROBOT ASSISTED MYOMECTOMY  07/02/2011   Procedure: ROBOTIC ASSISTED MYOMECTOMY;  Surgeon: Lovenia Kim, MD;  Location: [facility];  Service: Gynecology;  Laterality: N/A;  With Removal Of Paratubal Cyst; Cul De Sac Biopsy  . WISDOM TOOTH EXTRACTION      OB History    Gravida  2   Para  2   Term  1   Preterm      AB  0   Living  1     SAB      TAB      Ectopic      Multiple  0   Live Births  1            Home Medications    Prior to Admission medications   Medication Sig Start Date End Date Taking? Authorizing Provider  albuterol (VENTOLIN HFA) 108 (90 Base) MCG/ACT inhaler Inhale 2 puffs into the lungs every 6 (six) hours as needed for wheezing or shortness of breath. 02/10/20   Hall-Potvin, [place], PA-C  benzonatate (TESSALON) 100 MG capsule Take 1 capsule (100 mg total) by mouth every 8 (eight) hours. 02/10/20   Hall-Potvin, [place], PA-C  calcium carbonate (TUMS EX) 750 MG chewable tablet Chew 1  tablet by mouth daily. Patient not taking: Reported on 12/25/2019    [provider]  cetirizine (ZYRTEC ALLERGY) 10 MG tablet Take 1 tablet (10 mg total) by mouth daily. 02/10/20   Hall-Potvin, [place], PA-C  enoxaparin (LOVENOX) 30 MG/0.3ML injection Inject 0.4 mLs (40 mg total) into the skin every 12 (twelve) hours. 01/09/20   Alphonsa Overall, MD  fluticasone [facility]) 50 MCG/ACT nasal spray Place 1 spray into both nostrils daily. 02/10/20   Hall-Potvin, [place], PA-C  folic acid (FOLVITE) 1 MG tablet TAKE 4 TABLETS BY MOUTH ONCE A DAY Patient not taking: Reported on 12/25/2019 06/17/16   [provider]  hydrochlorothiazide (HYDRODIURIL) 25 MG tablet Take 1 tablet (25 mg total) by mouth daily for 7 days. Patient not taking: Reported on 12/25/2019 01/30/18 02/06/18  Fatima Blank A, CNM  labetalol (NORMODYNE) 100 MG tablet Take 1 tablet (100 mg total) by mouth 2 (two) times daily. Patient not taking: Reported on 12/25/2019 01/27/18   Diona Fanti, CNM  Multiple Vitamin (MULTIVITAMIN WITH MINERALS) TABS tablet Take 1 tablet by mouth daily.    [provider]  Prenatal Vit-Fe Fumarate-FA (PRENATAL MULTIVITAMIN) TABS tablet Take 1 tablet by mouth daily at 12 noon. Patient not taking: Reported on 12/25/2019    [provider]  traMADol (ULTRAM) 50 MG tablet Take 1 tablet (50 mg total) by mouth every 6 (six) hours as needed (pain). 01/09/20   Alphonsa Overall, MD    Family History Family History  Problem Relation Age of Onset  . Alcohol abuse Mother   . Prostate cancer Father   . Heart disease Other        grandfather  . Diabetes Other        grandfather  . Hypertension Other        grandfather  . Alcohol abuse Maternal Grandfather     Social History Social History   Tobacco Use  . Smoking status: Never Smoker  . Smokeless tobacco: Never Used  Vaping Use  . Vaping Use: Never used  Substance Use Topics  . Alcohol use: No  . Drug use: No     Allergies   Patient has no known allergies.   Review of Systems As per HPI   Physical Exam Triage Vital Signs ED Triage Vitals  Enc Vitals Group     BP 02/10/20 1518 (!) 147/99     Pulse Rate 02/10/20 1518 83     Resp 02/10/20 1518 18     Temp 02/10/20 1518 98.5 F (36.9 C)     Temp Source 02/10/20 1518 Oral     SpO2 02/10/20 1518 94 %     Weight --      Height --      Head Circumference --      Peak Flow --      Pain Score 02/10/20 1519 3     Pain Loc --      Pain Edu? --      Excl. in [place]? --    No data found.  Updated Vital Signs BP (!) 147/99 (BP Location: Left Arm)   Pulse 83   Temp 98.5 F (36.9 C) (Oral)   Resp 18   SpO2 94%   Visual Acuity Right Eye Distance:   Left Eye Distance:   Bilateral Distance:    Right Eye Near:   Left Eye Near:      Bilateral Near:     Physical Exam Constitutional:      General:  She is not in acute distress.    Appearance: She is obese. She is not ill-appearing or diaphoretic.  HENT:     Head: Normocephalic and atraumatic.     Mouth/Throat:     Mouth: Mucous membranes are moist.     Pharynx: Oropharynx is clear. No oropharyngeal exudate or posterior oropharyngeal erythema.  Eyes:     General: No scleral icterus.    Conjunctiva/sclera: Conjunctivae normal.     Pupils: Pupils are equal, round, and reactive to light.  Neck:     Comments: Trachea midline, negative JVD Cardiovascular:     Rate and Rhythm: Normal rate and regular rhythm.     Heart sounds: No murmur heard.  No gallop.   Pulmonary:     Effort: Pulmonary effort is normal. No respiratory distress.     Breath sounds: No stridor. Wheezing and rhonchi present. No rales.     Comments: Scattered, mild Musculoskeletal:     Cervical back: Neck supple. No tenderness.  Lymphadenopathy:     Cervical: No cervical adenopathy.  Skin:    Capillary Refill: Capillary refill takes less than 2 seconds.     Coloration: Skin is not jaundiced or pale.     Findings: No rash.  Neurological:     General: No focal deficit present.     Mental Status: She is alert and oriented to person, place, and time.      UC Treatments / Results  Labs (all labs ordered are listed, but only abnormal results are displayed) Labs Reviewed  NOVEL CORONAVIRUS, NAA    EKG   Radiology No results found.  Procedures Procedures (including critical care time)  Medications Ordered in UC Medications  methylPREDNISolone sodium succinate (SOLU-MEDROL) 125 mg/2 mL injection 125 mg (125 mg Intramuscular Given 02/10/20 1542)    Initial Impression / Assessment and Plan / UC Course  I have reviewed the triage vital signs and the nursing notes.  Pertinent labs & imaging results that were available during my care of the patient were reviewed by me and considered in my  medical decision making (see chart for details).     Patient afebrile, nontoxic, with SpO2 94%.  Covid PCR pending.  Patient to quarantine until results are back.  We will treat supportively as outlined below.  Given Solu-Medrol IM which she tolerated well-we will avoid prednisone as she is 1 month s/p gastric bypass and is not complaining about increased difficulty breathing.  Return precautions discussed, patient verbalized understanding and is agreeable to plan. Final Clinical Impressions(s) / UC Diagnoses   Final diagnoses:  Encounter for screening for COVID-19  URI with cough and congestion     Discharge Instructions     Your COVID test is pending - it is important to quarantine / isolate at home until your results are back. If you test positive and would like further evaluation for persistent or worsening symptoms, you may schedule an E-visit or virtual (video) visit throughout the [facility] app or website.  PLEASE NOTE: If you develop severe chest pain or shortness of breath please go to the ER or call 9-1-1 for further evaluation --> DO NOT schedule electronic or virtual visits for this. Please call our office for further guidance / recommendations as needed.  For information about the Covid vaccine, please visit [email or web address]    ED Prescriptions    Medication Sig Dispense Auth. Provider   fluticasone (FLONASE) 50 MCG/ACT nasal spray Place 1 spray into both nostrils daily. 16 g Hall-Potvin,  [place], PA-C   cetirizine (ZYRTEC ALLERGY) 10 MG tablet Take 1 tablet (10 mg total) by mouth daily. 30 tablet Hall-Potvin, [place], PA-C   benzonatate (TESSALON) 100 MG capsule Take 1 capsule (100 mg total) by mouth every 8 (eight) hours. 21 capsule Hall-Potvin, [place], PA-C   albuterol (VENTOLIN HFA) 108 (90 Base) MCG/ACT inhaler Inhale 2 puffs into the lungs every 6 (six) hours as needed for wheezing or shortness of breath. 18 g Hall-Potvin, [place], PA-C      PDMP not reviewed this encounter.   Hall-Potvin, [place], [place] 02/10/20 [phone]

## 2020-02-10 NOTE — ED Triage Notes (Signed)
Pt here for URI, cough with wheezing and fever x 4 days; pt had negative rapid covid on friday

## 2020-02-10 NOTE — Discharge Instructions (Addendum)
Your COVID test is pending - it is important to quarantine / isolate at home until your results are back. °If you test positive and would like further evaluation for persistent or worsening symptoms, you may schedule an E-visit or virtual (video) visit throughout the [facility] MyChart app or website. ° °PLEASE NOTE: If you develop severe chest pain or shortness of breath please go to the ER or call 9-1-1 for further evaluation --> DO NOT schedule electronic or virtual visits for this. °Please call our office for further guidance / recommendations as needed. ° °For information about the Covid vaccine, please visit [email or web address] °

## 2020-02-11 LAB — NOVEL CORONAVIRUS, NAA: SARS-CoV-2, NAA: NOT DETECTED

## 2020-02-11 LAB — SARS-COV-2, NAA 2 DAY TAT

## 2020-03-04 ENCOUNTER — Other Ambulatory Visit: Payer: Self-pay

## 2020-03-04 ENCOUNTER — Encounter: Payer: BC Managed Care – PPO | Attending: Surgery | Admitting: Dietician

## 2020-03-04 ENCOUNTER — Encounter: Payer: Self-pay | Admitting: Dietician

## 2020-03-04 DIAGNOSIS — E669 Obesity, unspecified: Secondary | ICD-10-CM | POA: Diagnosis not present

## 2020-03-04 NOTE — Patient Instructions (Signed)

## 2020-03-04 NOTE — Progress Notes (Signed)
Bariatric Nutrition Follow-Up Visit Medical Nutrition Therapy  Appt Start Time: 10:55am    End Time: 11:25am   2 Months Post-Operative RYGB Surgery Surgery Date: 01/07/2020  Pt's Expectations of Surgery/ Goals: to feel better and have more energy, be able to play with her young son more Pt Reported Successes: weight loss, no post-op issues     NUTRITION ASSESSMENT  Anthropometrics  Start weight at [facility]: 270.9 lbs (date: 07/27/2019) Today's weight: 228.3 lbs  Body Composition Scale 03/04/20  Weight  lbs 228.3  BMI 39  Total Body Fat  % 42.3     Visceral Fat 12  Fat-Free Mass  % 57.6     Total Body Water  % 43.3     Muscle-Mass  lbs 32.3  Body Fat Displacement ---         Torso  lbs 59.9         Left Leg  lbs 11.9         Right Leg  lbs 11.9         Left Arm  lbs 5.9         Right Arm  lbs 5.9    Lifestyle & Dietary Hx Patient states she does not tolerate eggs well. Does fine with shrimp, chicken, and pork tenderloin. States it is difficult for her to drink enough fluids throughout the day, especially when at work since it is very busy. States she previously had issues with constipation but taking a stool softener helps.   24-Hr Dietary Recall First Meal: yogurt  Snack: cheese  Second Meal: refried beans  Snack: protein shake   Third Meal: ground beef (or steak) Snack: - Beverages: water, Crystal Light, [employer] orangeade    Estimated daily fluid intake: 30-40 oz Estimated daily protein intake: 60 g Supplements: bariatric MVI Celebrate, calcium  Current average weekly physical activity: walking 2-3 times per week    Post-Op Goals/ Signs/ Symptoms Using straws: no Drinking while eating: no Chewing/swallowing difficulties: no Changes in vision: no Changes to mood/headaches: no Hair loss/changes to skin/nails: no Difficulty focusing/concentrating: no Sweating: no Dizziness/lightheadedness: yes (states related to low BP) Palpitations: no  Carbonated/caffeinated  beverages: no N/V/D/C/Gas: constipation  Abdominal pain: no Dumping syndrome: no   NUTRITION DIAGNOSIS  Overweight/obesity ([place]-3.3) related to past poor dietary habits and physical inactivity as evidenced by completed bariatric surgery and following dietary guidelines for continued weight loss and healthy nutrition status.   NUTRITION INTERVENTION Nutrition counseling (C-1) and education (E-2) to facilitate bariatric surgery goals, including: . Diet advancement to the next phase (phase 4) now including non-starchy vegetables . The importance of consuming adequate calories as well as certain nutrients daily due to the body's need for essential vitamins, minerals, and fats . The importance of daily physical activity and to reach a goal of at least 150 minutes of moderate to vigorous physical activity weekly (or as directed by their physician) due to benefits such as increased musculature and improved lab values  Handouts Provided Include   Phase 4: Protein + Non-Starchy Vegetables   Learning Style & Readiness for Change Teaching method utilized: Visual & Auditory  Demonstrated degree of understanding via: Teach Back  Barriers to learning/adherence to lifestyle change: None Identified    MONITORING & EVALUATION Dietary intake, weekly physical activity, body weight, and goals in 4 months.  Next Steps Patient is to follow-up in 4 months for 6 month post-op follow-up.

## 2020-03-05 DIAGNOSIS — Z0000 Encounter for general adult medical examination without abnormal findings: Secondary | ICD-10-CM | POA: Diagnosis not present

## 2020-03-05 DIAGNOSIS — R7989 Other specified abnormal findings of blood chemistry: Secondary | ICD-10-CM | POA: Diagnosis not present

## 2020-03-12 DIAGNOSIS — Z0000 Encounter for general adult medical examination without abnormal findings: Secondary | ICD-10-CM | POA: Diagnosis not present

## 2020-03-12 DIAGNOSIS — H9313 Tinnitus, bilateral: Secondary | ICD-10-CM | POA: Diagnosis not present

## 2020-05-09 DIAGNOSIS — Z01419 Encounter for gynecological examination (general) (routine) without abnormal findings: Secondary | ICD-10-CM | POA: Diagnosis not present

## 2020-05-27 DIAGNOSIS — D225 Melanocytic nevi of trunk: Secondary | ICD-10-CM | POA: Diagnosis not present

## 2020-05-27 DIAGNOSIS — Z1283 Encounter for screening for malignant neoplasm of skin: Secondary | ICD-10-CM | POA: Diagnosis not present

## 2020-05-27 DIAGNOSIS — D485 Neoplasm of uncertain behavior of skin: Secondary | ICD-10-CM | POA: Diagnosis not present

## 2020-06-19 IMAGING — RF DG UGI W SINGLE CM
9 of 10 series · 13 of 19 positions shown · non-contrast
Comparison: CT abdomen/pelvis 11/23/2010

CLINICAL DATA: Morbid obesity. Additional history provided: The
patient reports preoperative examination for planned Roux-en-Y
gastric bypass surgery, no abdominal complaints.

EXAM:
UPPER GI SERIES WITH KUB
TECHNIQUE: After obtaining a scout radiograph a routine upper GI series was
performed using thin barium.
FLUOROSCOPY TIME:  Fluoroscopy Time:  1 minutes, 42 seconds.
Radiation Exposure Index (if provided by the fluoroscopic device):
58.1 mGy
Number of Acquired Spot Images: 8

[Series 1: t abdomen supine · 0.15mm/px · 1 of 1 slices shown]
[im 1/1]
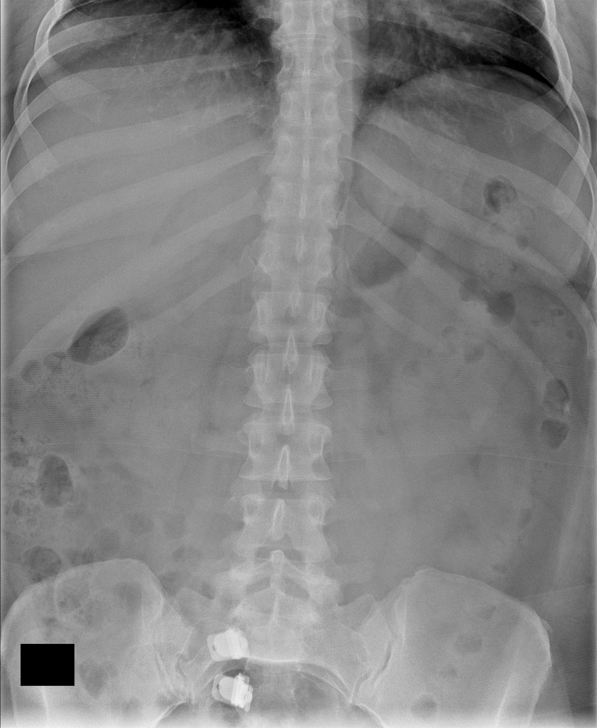

[Series 2: cp_standard · 0.34mm/px · 2 of 109 frames shown (1 of 4)]
[frame 17/109]
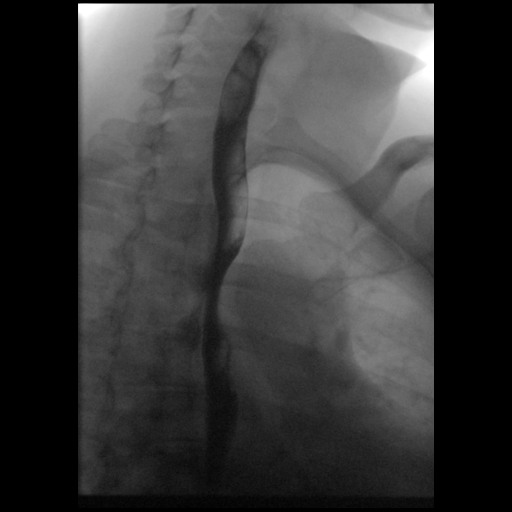
[frame 55/109]
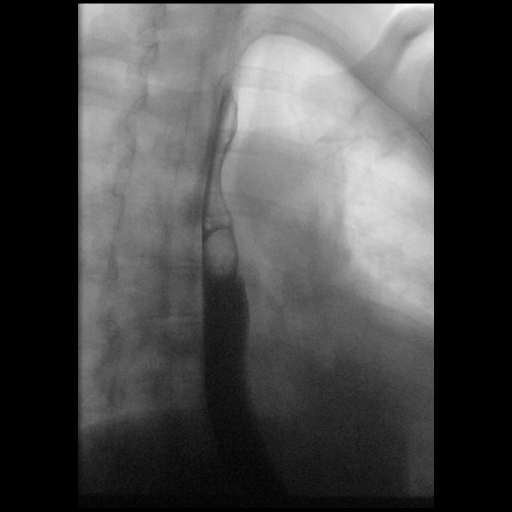

[Series 3: fluoro_barium 2fps_bw · 0.17mm/px · 2 of 2 frames shown (1 of 4)]
[frame 1/2]
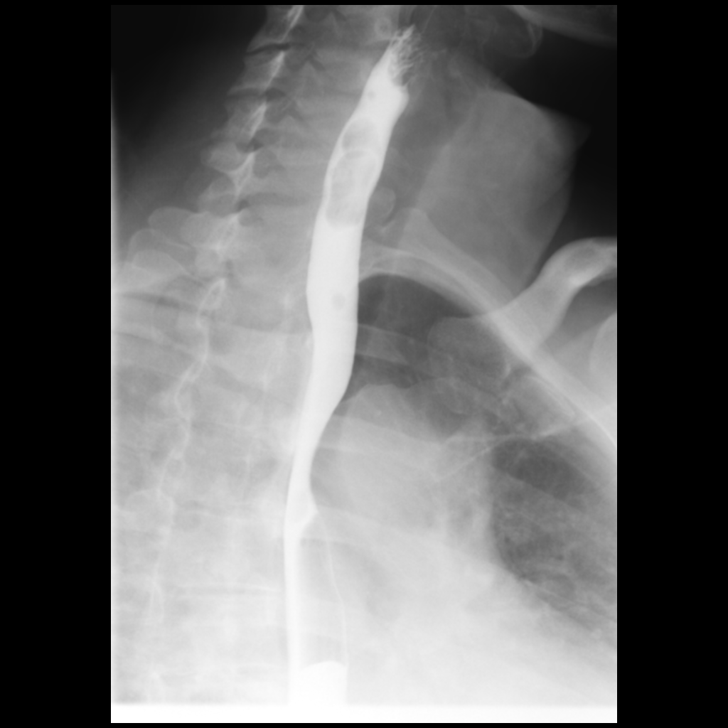
[frame 2/2]
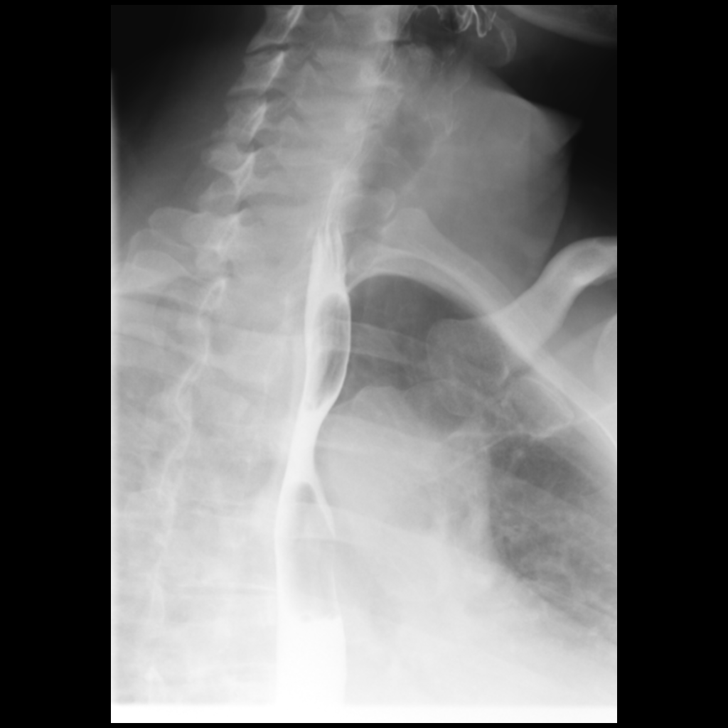

[Series 4: fluoro_barium 2fps_bw · 0.17mm/px · 1 of 2 frames shown (2 of 4)]
[frame 2/2]
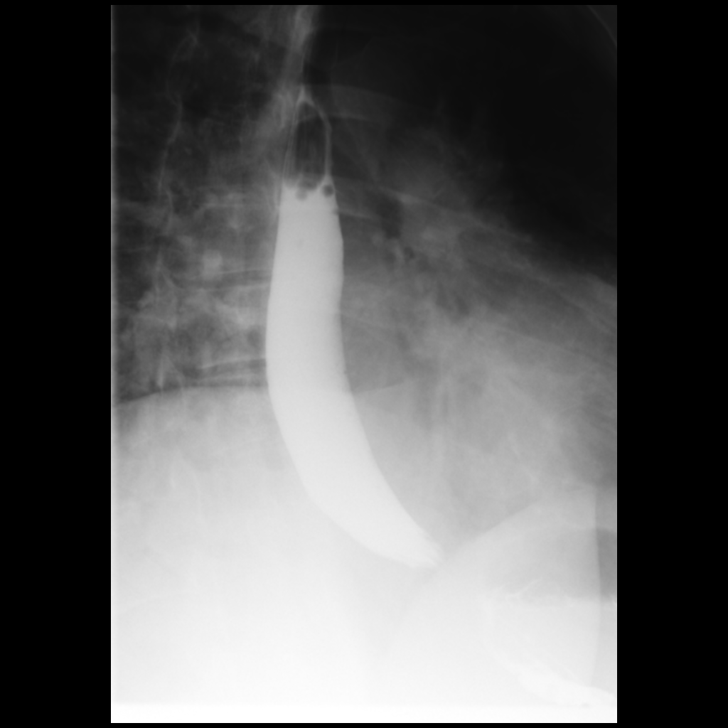

[Series 5: cp_standard · 0.18mm/px · 1 of 1 slices shown (2 of 4)]
[im 1/1]
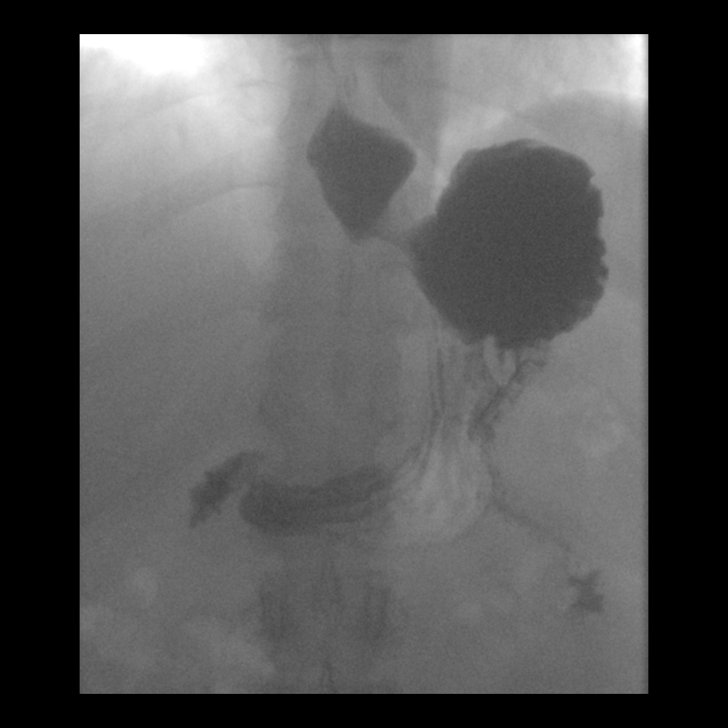

[Series 6: fluoro_barium 2fps_bw · 0.18mm/px · 1 of 1 slices shown (3 of 4)]
[im 1/1]
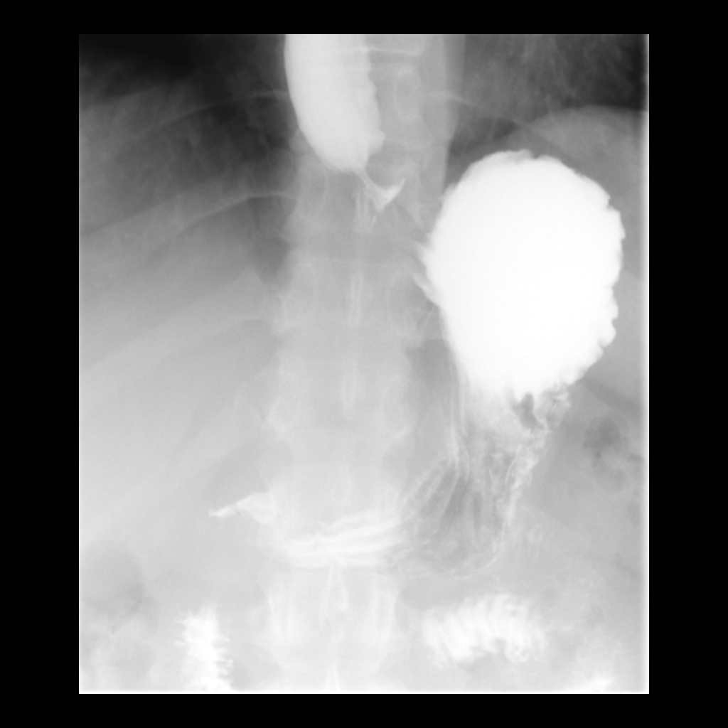

[Series 8: cp_standard · 0.18mm/px · 1 of 1 slices shown (3 of 4)]
[im 1/1]
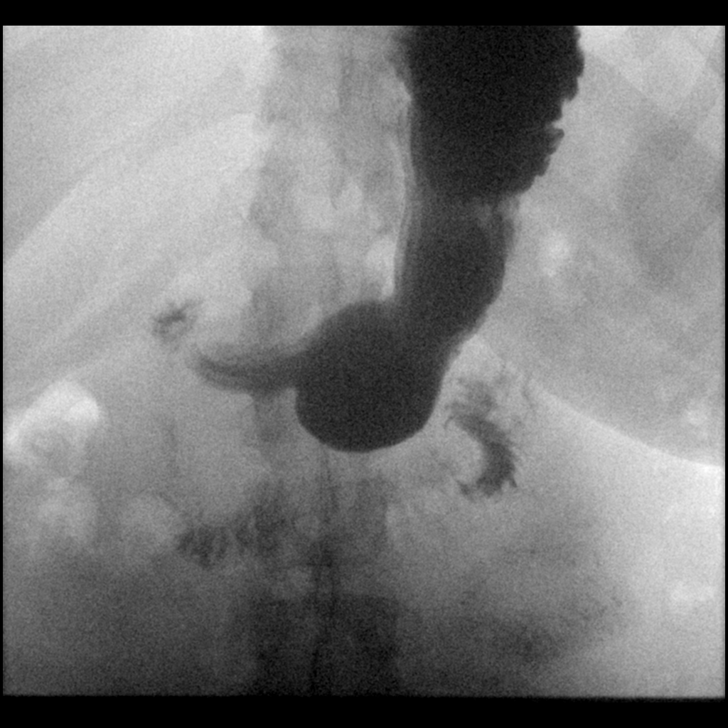

[Series 9: cp_standard · 0.43mm/px · 3 of 120 frames shown (4 of 4)]
[frame 19/120]
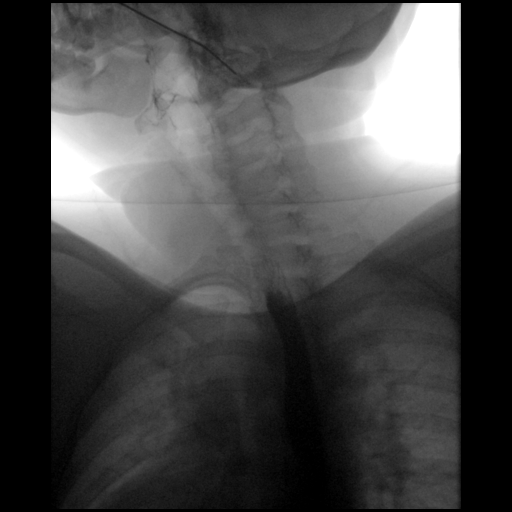
[frame 61/120]
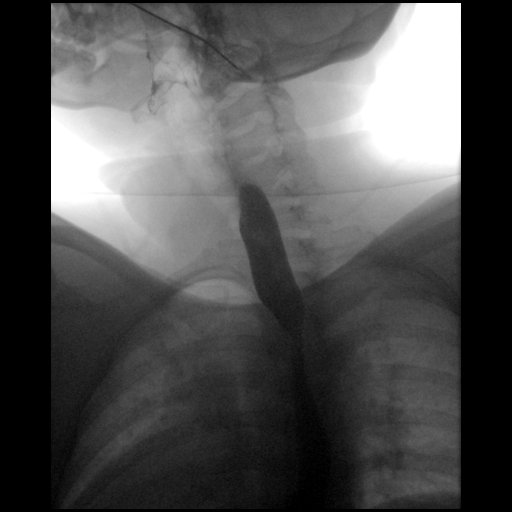
[frame 103/120]
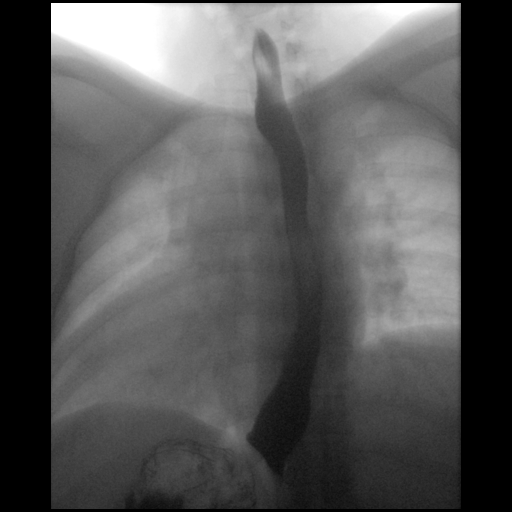

[Series 10: fluoro_barium 2fps_bw · 0.22mm/px · 1 of 2 frames shown (4 of 4)]
[frame 2/2]
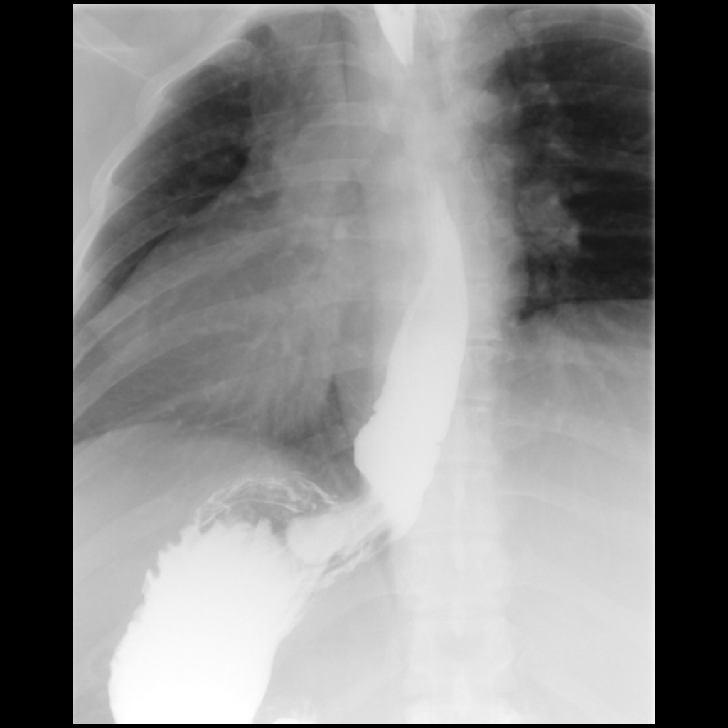

[13 of 19 positions shown; findings below may reference images not displayed]

FINDINGS: The scout radiograph of the abdomen demonstrates a nonobstructive
bowel gas pattern. The bones are unremarkable.

Fluoroscopic evaluation demonstrates normal caliber and smooth
contour of the esophagus. No evidence of fixed stricture, mass or
mucosal abnormality. Normal esophageal motility was observed. No
hiatal hernia. Small volume gastroesophageal reflux was observed
with the patient in the supine position.

Normal appearance of the stomach, duodenal bulb and duodenal sweep.
No evidence of ulceration, fold thickening or mass.
IMPRESSION: Small-volume gastroesophageal reflux observed with the patient in a
supine position.

Otherwise unremarkable upper GI series as detailed.

## 2020-07-08 ENCOUNTER — Encounter: Payer: No Typology Code available for payment source | Attending: Surgery | Admitting: Skilled Nursing Facility1

## 2020-07-08 ENCOUNTER — Other Ambulatory Visit: Payer: Self-pay

## 2020-07-08 DIAGNOSIS — E669 Obesity, unspecified: Secondary | ICD-10-CM | POA: Insufficient documentation

## 2020-07-09 NOTE — Progress Notes (Signed)
Follow-up visit:  Post-Operative RYGB Surgery  Medical Nutrition Therapy:  Appt start time: 6:00pm end time:  7:00pm  Primary concerns today: Post-operative Bariatric Surgery Nutrition Management 6 Month Post-Op Class  Virtual Visit: pt identified via DOB and name; pt verbally agrees to the limitations of this visit type   Information Reviewed/ Discussed During Appointment: -Review of composition scale numbers -Fluid requirements (64-100 ounces) -Protein requirements (60-80g) -Strategies for tolerating diet -Advancement of diet to include Starchy vegetables -Barriers to inclusion of new foods -Inclusion of appropriate multivitamin and calcium supplements  -Exercise recommendations   Fluid intake: adequate   Medications: See List Supplementation: appropriate   Using straws: no Drinking while eating: no Having you been chewing well: yes Chewing/swallowing difficulties: no Changes in vision: no Changes to mood/headaches: no Hair loss/[place] to skin/Changes to nails: no Any difficulty focusing or concentrating: no Sweating: no Dizziness/Lightheaded: no Palpitations: no  Carbonated beverages: no N/V/D/C/GAS: no Abdominal Pain: no Dumping syndrome: no  Recent physical activity:  ADL's  Progress Towards Goal(s):  In Progress Teaching method utilized: Visual & Auditory  Demonstrated degree of understanding via: Teach Back  Readiness Level: Action Barriers to learning/adherence to lifestyle change: none identified  Handouts given during visit include:  Phase V diet Progression   Goals Sheet  The Benefits of Exercise are endless.....  Support Group Topics   Teaching Method Utilized:  Visual Auditory Hands on  Demonstrated degree of understanding via:  Teach Back   Monitoring/Evaluation:  Dietary intake, exercise, and body weight. Follow up in 3 months for 9 month post-op visit.

## 2021-09-03 ENCOUNTER — Emergency Department (HOSPITAL_COMMUNITY): Payer: BC Managed Care – PPO

## 2021-09-03 ENCOUNTER — Other Ambulatory Visit: Payer: Self-pay

## 2021-09-03 ENCOUNTER — Encounter (HOSPITAL_COMMUNITY): Payer: Self-pay

## 2021-09-03 ENCOUNTER — Emergency Department (HOSPITAL_COMMUNITY)
Admission: EM | Admit: 2021-09-03 | Discharge: 2021-09-03 | Disposition: A | Discharge status: Home / Self Care | Payer: BC Managed Care – PPO | Attending: Emergency Medicine | Admitting: Emergency Medicine

## 2021-09-03 DIAGNOSIS — R1011 Right upper quadrant pain: Secondary | ICD-10-CM | POA: Diagnosis not present

## 2021-09-03 DIAGNOSIS — D72829 Elevated white blood cell count, unspecified: Secondary | ICD-10-CM | POA: Insufficient documentation

## 2021-09-03 DIAGNOSIS — R231 Pallor: Secondary | ICD-10-CM | POA: Diagnosis not present

## 2021-09-03 DIAGNOSIS — K8050 Calculus of bile duct without cholangitis or cholecystitis without obstruction: Secondary | ICD-10-CM

## 2021-09-03 DIAGNOSIS — R0781 Pleurodynia: Secondary | ICD-10-CM | POA: Diagnosis not present

## 2021-09-03 DIAGNOSIS — R001 Bradycardia, unspecified: Secondary | ICD-10-CM | POA: Diagnosis not present

## 2021-09-03 DIAGNOSIS — R109 Unspecified abdominal pain: Secondary | ICD-10-CM | POA: Diagnosis not present

## 2021-09-03 DIAGNOSIS — I959 Hypotension, unspecified: Secondary | ICD-10-CM | POA: Diagnosis not present

## 2021-09-03 DIAGNOSIS — K824 Cholesterolosis of gallbladder: Secondary | ICD-10-CM | POA: Diagnosis not present

## 2021-09-03 LAB — COMPREHENSIVE METABOLIC PANEL
ALT: 75 U/L — ABNORMAL HIGH (ref 0–44)
AST: 126 U/L — ABNORMAL HIGH (ref 15–41)
Albumin: 4.1 g/dL (ref 3.5–5.0)
Alkaline Phosphatase: 60 U/L (ref 38–126)
Anion gap: 9 (ref 5–15)
BUN: 16 mg/dL (ref 6–20)
CO2: 19 mmol/L — ABNORMAL LOW (ref 22–32)
Calcium: 9 mg/dL (ref 8.9–10.3)
Chloride: 111 mmol/L (ref 98–111)
Creatinine, Ser: 0.53 mg/dL (ref 0.44–1.00)
GFR, Estimated: >60 mL/min (ref >60)
Glucose, Bld: 97 mg/dL (ref 70–99)
Potassium: 3.6 mmol/L (ref 3.5–5.1)
Sodium: 139 mmol/L (ref 135–145)
Total Bilirubin: 0.8 mg/dL (ref 0.3–1.2)
Total Protein: 7.1 g/dL (ref 6.5–8.1)

## 2021-09-03 LAB — CBC WITH DIFFERENTIAL/PLATELET
Abs Immature Granulocytes: 0.05 10*3/uL (ref 0.00–0.07)
Basophils Absolute: 0.1 10*3/uL (ref 0.0–0.1)
Basophils Relative: 0 %
Eosinophils Absolute: 0.2 10*3/uL (ref 0.0–0.5)
Eosinophils Relative: 2 %
HCT: 43.4 % (ref 36.0–46.0)
Hemoglobin: 14.3 g/dL (ref 12.0–15.0)
Immature Granulocytes: 0 %
Lymphocytes Relative: 18 %
Lymphs Abs: 2.6 10*3/uL (ref 0.7–4.0)
MCH: 29.7 pg (ref 26.0–34.0)
MCHC: 32.9 g/dL (ref 30.0–36.0)
MCV: 90.2 fL (ref 80.0–100.0)
Monocytes Absolute: 1 10*3/uL (ref 0.1–1.0)
Monocytes Relative: 7 %
Neutro Abs: 10.4 10*3/uL — ABNORMAL HIGH (ref 1.7–7.7)
Neutrophils Relative %: 73 %
Platelets: 183 10*3/uL (ref 150–400)
RBC: 4.81 MIL/uL (ref 3.87–5.11)
RDW: 13.4 % (ref 11.5–15.5)
WBC: 14.3 10*3/uL — ABNORMAL HIGH (ref 4.0–10.5)
nRBC: 0 % (ref 0.0–0.2)

## 2021-09-03 LAB — I-STAT BETA HCG BLOOD, ED (MC, WL, AP ONLY): I-stat hCG, quantitative: <5 m[IU]/mL (ref <5)

## 2021-09-03 LAB — LIPASE, BLOOD: Lipase: 35 U/L (ref 11–51)

## 2021-09-03 MED ORDER — LACTATED RINGERS IV BOLUS
1000.0000 mL | Freq: Once | INTRAVENOUS | Status: AC
  Administered 2021-09-03: 1000 mL via INTRAVENOUS

## 2021-09-03 MED ORDER — ONDANSETRON HCL 4 MG/2ML IJ SOLN
4.0000 mg | Freq: Once | INTRAMUSCULAR | Status: AC
  Administered 2021-09-03: 4 mg via INTRAVENOUS
  Filled 2021-09-03: qty 2

## 2021-09-03 MED ORDER — SODIUM CHLORIDE (PF) 0.9 % IJ SOLN
INTRAMUSCULAR | Status: AC
  Filled 2021-09-03: qty 50

## 2021-09-03 MED ORDER — IOHEXOL 300 MG/ML  SOLN
100.0000 mL | Freq: Once | INTRAMUSCULAR | Status: AC | PRN
  Administered 2021-09-03: 100 mL via INTRAVENOUS

## 2021-09-03 MED ORDER — FENTANYL CITRATE PF 50 MCG/ML IJ SOSY
50.0000 ug | PREFILLED_SYRINGE | Freq: Once | INTRAMUSCULAR | Status: AC
  Administered 2021-09-03: 50 ug via INTRAVENOUS
  Filled 2021-09-03: qty 1

## 2021-09-03 NOTE — ED Provider Notes (Signed)
?[facility] DEPT ?Provider Note ? ? ?CSN: [id number] ?Arrival date & time: 09/03/21  0145 ? ?  ? ?History ? ?Chief Complaint  ?Patient presents with  ? Abdominal Pain  ? ? ?[name] is a 41 y.o. female. ? ?The history is provided by the patient and medical records.  ?Abdominal Pain ?[name] is a 41 y.o. female who presents to the Emergency Department complaining of abdominal pain.  She presents to the ED by EMS for evaluation of sharp RUQ abdominal pain that woke her from sleep around 1230pm.  Has associated nausea.  Tried tums at home with no relief.  Has had some diarrhea this week.  No prior similar sxs.  No fever, SOB, vomiting, dysuria.   ? ?Has a hx/o gastric bypass in 2021.  No known medical problems.  ?  ? ?Home Medications ?Prior to Admission medications   ?Medication Sig Start Date End Date Taking? Authorizing Provider  ?albuterol (VENTOLIN HFA) 108 (90 Base) MCG/ACT inhaler Inhale 2 puffs into the lungs every 6 (six) hours as needed for wheezing or shortness of breath. 02/10/20   Hall-Potvin, [place], PA-C  ?benzonatate (TESSALON) 100 MG capsule Take 1 capsule (100 mg total) by mouth every 8 (eight) hours. 02/10/20   Hall-Potvin, [place], PA-C  ?calcium carbonate (TUMS EX) 750 MG chewable tablet Chew 1 tablet by mouth daily. ?Patient not taking: Reported on 12/25/2019    [provider]  ?cetirizine (ZYRTEC ALLERGY) 10 MG tablet Take 1 tablet (10 mg total) by mouth daily. 02/10/20   Hall-Potvin, [place], PA-C  ?enoxaparin (LOVENOX) 30 MG/0.3ML injection Inject 0.4 mLs (40 mg total) into the skin every 12 (twelve) hours. 01/09/20   Alphonsa Overall, MD  ?fluticasone [facility]) 50 MCG/ACT nasal spray Place 1 spray into both nostrils daily. 02/10/20   Hall-Potvin, [place], PA-C  ?folic acid (FOLVITE) 1 MG tablet TAKE 4 TABLETS BY MOUTH ONCE A DAY ?Patient not taking: Reported on 12/25/2019 06/17/16   [provider]  ?hydrochlorothiazide (HYDRODIURIL) 25 MG  tablet Take 1 tablet (25 mg total) by mouth daily for 7 days. ?Patient not taking: Reported on 12/25/2019 01/30/18 02/06/18  Fatima Blank A, CNM  ?labetalol (NORMODYNE) 100 MG tablet Take 1 tablet (100 mg total) by mouth 2 (two) times daily. ?Patient not taking: Reported on 12/25/2019 01/27/18   Diona Fanti, CNM  ?Multiple Vitamin (MULTIVITAMIN WITH MINERALS) TABS tablet Take 1 tablet by mouth daily.    [provider]  ?Prenatal Vit-Fe Fumarate-FA (PRENATAL MULTIVITAMIN) TABS tablet Take 1 tablet by mouth daily at 12 noon. ?Patient not taking: Reported on 12/25/2019    [provider]  ?traMADol (ULTRAM) 50 MG tablet Take 1 tablet (50 mg total) by mouth every 6 (six) hours as needed (pain). 01/09/20   Alphonsa Overall, MD  ?   ? ?Allergies    ?Patient has no known allergies.   ? ?Review of Systems   ?Review of Systems  ?Gastrointestinal:  Positive for abdominal pain.  ?All other systems reviewed and are negative. ? ?Physical Exam ?Updated Vital Signs ?BP 110/61 (BP Location: Right Arm)   Pulse 61   Temp 97.7 ?F (36.5 ?C) (Oral)   Resp 17   Ht '5\' 4"'$  (1.626 m)   Wt 72.6 kg   SpO2 98%   BMI 27.46 kg/m?  ?Physical Exam ?Vitals and nursing note reviewed.  ?Constitutional:   ?   Appearance: She is well-developed.  ?HENT:  ?   Head: Normocephalic and atraumatic.  ?  Cardiovascular:  ?   Rate and Rhythm: Normal rate and regular rhythm.  ?Pulmonary:  ?   Effort: Pulmonary effort is normal. No respiratory distress.  ?Abdominal:  ?   Palpations: Abdomen is soft.  ?   Tenderness: There is no abdominal tenderness.  ?   Comments: Moderate epigastric and ruq tenderness, positive murphys  ?Musculoskeletal:     ?   General: No tenderness.  ?Skin: ?   General: Skin is warm and dry.  ?Neurological:  ?   Mental Status: She is alert and oriented to person, place, and time.  ?Psychiatric:     ?   Behavior: Behavior normal.  ? ? ?ED Results / Procedures / Treatments   ?Labs ?(all labs ordered are listed,  but only abnormal results are displayed) ?Labs Reviewed  ?COMPREHENSIVE METABOLIC PANEL - Abnormal; Notable for the following components:  ?    Result Value  ? CO2 19 (*)   ? AST 126 (*)   ? ALT 75 (*)   ? All other components within normal limits  ?CBC WITH DIFFERENTIAL/PLATELET - Abnormal; Notable for the following components:  ? WBC 14.3 (*)   ? Neutro Abs 10.4 (*)   ? All other components within normal limits  ?LIPASE, BLOOD  ?URINALYSIS, ROUTINE W REFLEX MICROSCOPIC  ?I-STAT BETA HCG BLOOD, ED ([facility], [facility], [facility] ONLY)  ? ? ?EKG ?None ? ?Radiology ?CT Abdomen Pelvis W Contrast ? ?Result Date: 09/03/2021 ?CLINICAL DATA:  Right upper quadrant pain. History of gastric bypass EXAM: CT ABDOMEN AND PELVIS WITH CONTRAST TECHNIQUE: Multidetector CT imaging of the abdomen and pelvis was performed using the standard protocol following bolus administration of intravenous contrast. RADIATION DOSE REDUCTION: This exam was performed according to the departmental dose-optimization program which includes automated exposure control, adjustment of the mA and/or kV according to patient size and/or use of iterative reconstruction technique. CONTRAST:  150m OMNIPAQUE IOHEXOL 300 MG/ML  SOLN COMPARISON:  11/23/2010 FINDINGS: Lower chest:  No contributory findings. Hepatobiliary: Mild periportal edema, often attributed to volume resuscitation.No evidence of biliary obstruction or stone. See preceding ultrasound. Pancreas: Unremarkable. Spleen: Unremarkable. Adrenals/Urinary Tract: Negative adrenals. No hydronephrosis or stone. Unremarkable bladder. Stomach/Bowel: No obstruction. Gastric bypass. No complicating features. No visible bowel inflammation Vascular/Lymphatic: No acute vascular abnormality. No mass or adenopathy. Reproductive:No pathologic findings. Other: No ascites or pneumoperitoneum. Musculoskeletal: No acute abnormalities. IMPRESSION: No acute finding.  Uncomplicated gastric bypass. Electronically Signed   By: JJorje Guild M.D.   On: 09/03/2021 04:55  ? ?UKoreaAbdomen Limited RUQ (LIVER/GB) ? ?Result Date: 09/03/2021 ?CLINICAL DATA:  Right upper quadrant pain EXAM: ULTRASOUND ABDOMEN LIMITED RIGHT UPPER QUADRANT COMPARISON:  08/28/2019 FINDINGS: Gallbladder: Small echogenic nonshadowing 2 mm focus along the posterior gallbladder wall, likely small polyp. No wall thickening, stones or sonographic Murphy sign. Trace pericholecystic fluid. Common bile duct: Diameter: Normal caliber, 5 mm Liver: No focal lesion identified. Within normal limits in parenchymal echogenicity. Portal vein is patent on color Doppler imaging with normal direction of blood flow towards the liver. Other: None. IMPRESSION: Small posterior gallbladder wall polyp. Trace pericholecystic fluid between the gallbladder and liver. No visible stones, wall thickening or sonographic Murphy sign. Electronically Signed   By: KRolm BaptiseM.D.   On: 09/03/2021 03:32   ? ?Procedures ?Procedures  ? ? ?Medications Ordered in ED ?Medications  ?fentaNYL (SUBLIMAZE) injection 50 mcg (50 mcg Intravenous Given 09/03/21 0249)  ?ondansetron ([facility] injection 4 mg (4 mg Intravenous Given 09/03/21 0249)  ?lactated ringers bolus  1,000 mL (0 mLs Intravenous Stopped 09/03/21 0343)  ?iohexol (OMNIPAQUE) 300 MG/ML solution 100 mL (100 mLs Intravenous Contrast Given 09/03/21 0425)  ?sodium chloride (PF) 0.9 % injection (  Given by Other 09/03/21 0442)  ? ? ?ED Course/ Medical Decision Making/ A&P ?  ?                        ?Medical Decision Making ?Amount and/or Complexity of Data Reviewed ?Labs: ordered. ?Radiology: ordered. ? ?Risk ?Prescription drug management. ? ? ?Patient with history of gastric bypass here for evaluation of right upper quadrant pain.  Patient with significant tenderness on initial assessment.  Right upper quadrant ultrasound with gallbladder polyp and trace pericholecystic fluid but no evidence of cholecystitis.  CBC with leukocytosis.  Transaminases are minimally elevated.  On repeat  assessment after pain medications pain is completely resolved and abdomen is soft and nontender.  Given her lab abnormalities a CT scan was obtained to rule out additional processes. ?CT scan without acute abn

## 2021-09-03 NOTE — ED Notes (Signed)
Patient moved to RM 8 for ultrasound.  ?

## 2021-09-03 NOTE — ED Notes (Signed)
To CT

## 2021-09-03 NOTE — ED Triage Notes (Signed)
Patient complains of sharp pain underneath her right rib, that radiates to her back. Patient denies fall or injury. Patient became dizzy and clammy after getting up to use the bathroom. Patient originally thought it was heartburn and took TUMS at home. Patient was sinus brady with EMS with soft blood pressures.  ?

## 2021-09-03 NOTE — ED Notes (Signed)
Back from CT

## 2021-09-03 NOTE — Discharge Instructions (Signed)
Please follow-up with your surgeon for repeat evaluation.  Return to the emergency department if your pain returns and is uncontrolled or you have new concerning symptoms. ?

## 2021-10-28 DIAGNOSIS — R7989 Other specified abnormal findings of blood chemistry: Secondary | ICD-10-CM | POA: Diagnosis not present

## 2021-10-28 DIAGNOSIS — Z0000 Encounter for general adult medical examination without abnormal findings: Secondary | ICD-10-CM | POA: Diagnosis not present

## 2021-11-03 DIAGNOSIS — H903 Sensorineural hearing loss, bilateral: Secondary | ICD-10-CM | POA: Diagnosis not present

## 2021-11-03 DIAGNOSIS — Z1331 Encounter for screening for depression: Secondary | ICD-10-CM | POA: Diagnosis not present

## 2021-11-03 DIAGNOSIS — Z0000 Encounter for general adult medical examination without abnormal findings: Secondary | ICD-10-CM | POA: Diagnosis not present

## 2021-11-03 DIAGNOSIS — Z1339 Encounter for screening examination for other mental health and behavioral disorders: Secondary | ICD-10-CM | POA: Diagnosis not present

## 2021-11-09 DIAGNOSIS — N921 Excessive and frequent menstruation with irregular cycle: Secondary | ICD-10-CM | POA: Diagnosis not present

## 2021-11-18 DIAGNOSIS — M79645 Pain in left finger(s): Secondary | ICD-10-CM | POA: Diagnosis not present

## 2021-12-10 DIAGNOSIS — Z9884 Bariatric surgery status: Secondary | ICD-10-CM | POA: Diagnosis not present

## 2021-12-10 DIAGNOSIS — Z1321 Encounter for screening for nutritional disorder: Secondary | ICD-10-CM | POA: Diagnosis not present

## 2021-12-10 DIAGNOSIS — E569 Vitamin deficiency, unspecified: Secondary | ICD-10-CM | POA: Diagnosis not present

## 2021-12-22 DIAGNOSIS — E569 Vitamin deficiency, unspecified: Secondary | ICD-10-CM | POA: Diagnosis not present

## 2021-12-22 DIAGNOSIS — Z1321 Encounter for screening for nutritional disorder: Secondary | ICD-10-CM | POA: Diagnosis not present

## 2021-12-22 DIAGNOSIS — R5383 Other fatigue: Secondary | ICD-10-CM | POA: Diagnosis not present

## 2021-12-22 DIAGNOSIS — E559 Vitamin D deficiency, unspecified: Secondary | ICD-10-CM | POA: Diagnosis not present

## 2021-12-28 DIAGNOSIS — Z01411 Encounter for gynecological examination (general) (routine) with abnormal findings: Secondary | ICD-10-CM | POA: Diagnosis not present

## 2021-12-28 DIAGNOSIS — Z0142 Encounter for cervical smear to confirm findings of recent normal smear following initial abnormal smear: Secondary | ICD-10-CM | POA: Diagnosis not present

## 2021-12-28 DIAGNOSIS — N939 Abnormal uterine and vaginal bleeding, unspecified: Secondary | ICD-10-CM | POA: Diagnosis not present

## 2021-12-28 DIAGNOSIS — N921 Excessive and frequent menstruation with irregular cycle: Secondary | ICD-10-CM | POA: Diagnosis not present

## 2021-12-28 DIAGNOSIS — Z6830 Body mass index (BMI) 30.0-30.9, adult: Secondary | ICD-10-CM | POA: Diagnosis not present

## 2021-12-28 DIAGNOSIS — Z124 Encounter for screening for malignant neoplasm of cervix: Secondary | ICD-10-CM | POA: Diagnosis not present

## 2021-12-28 DIAGNOSIS — Z01419 Encounter for gynecological examination (general) (routine) without abnormal findings: Secondary | ICD-10-CM | POA: Diagnosis not present

## 2021-12-28 DIAGNOSIS — Z1231 Encounter for screening mammogram for malignant neoplasm of breast: Secondary | ICD-10-CM | POA: Diagnosis not present

## 2022-01-06 DIAGNOSIS — R922 Inconclusive mammogram: Secondary | ICD-10-CM | POA: Diagnosis not present

## 2022-01-06 DIAGNOSIS — M793 Panniculitis, unspecified: Secondary | ICD-10-CM | POA: Diagnosis not present

## 2022-01-06 DIAGNOSIS — R928 Other abnormal and inconclusive findings on diagnostic imaging of breast: Secondary | ICD-10-CM | POA: Diagnosis not present

## 2022-01-06 DIAGNOSIS — N6321 Unspecified lump in the left breast, upper outer quadrant: Secondary | ICD-10-CM | POA: Diagnosis not present

## 2022-01-06 DIAGNOSIS — N6311 Unspecified lump in the right breast, upper outer quadrant: Secondary | ICD-10-CM | POA: Diagnosis not present

## 2022-04-14 DIAGNOSIS — M793 Panniculitis, unspecified: Secondary | ICD-10-CM | POA: Diagnosis not present

## 2022-04-14 NOTE — H&P (Signed)
Subjective:     Patient ID: [name] is a 41 y.o. female.   HPI    Returns for discussion panniculectomy. Highest weight 270 lb. Underwent gastric bypass 12/2019. Lowest weight following this 159 lb. Notes spouse passed 2 months ago and had some recent weight gain, otherwise has been stable for over 6 months. Reports recurrent rashes beneath abdominal skin for over year. This has persisted despite use nystatin, desitin topical products and use antifungal wash for over 6 month trial.   PMH significant for prothrombin gene mutation. This was diagnosed following miscarriage and during subsequent pregnancy was on Lovenox and ASA. Reports did not require home Lovenox following bypass surgery. Previously seen by Dr. Cherene Altes.   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 Notes she can sit at work if needed. [name]. [name].   Review of Systems  Constitutional: Positive for fatigue.  Skin: Positive for rash.    Remainder 12 point review negative    Objective:   Physical Exam Cardiovascular:     Rate and Rhythm: Normal rate and regular rhythm.     Heart sounds: Normal heart sounds.  Pulmonary:     Effort: Pulmonary effort is normal.     Breath sounds: Normal breath sounds.  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   Reviewed panniculectomy vs abdominoplasty. Reviewed this is not a cosmetic procedure. Reviewed changes with aging, wt gain/loss, will not have taught skin given weight changes and loss elasticity, will not affect contour upper abdomen. Reviewed OP vs overnight surgery, drains, post op limitations. Reviewed scar maturation  over months. Reviewed in mirror area of scars area soft tissue resection and expected elevation mons. Counseled will not change contour mons.   Additional risks including but not limited to bleeding, hematoma, seroma, need for additional procedures, damage to adjacent structures, unacceptable cosmetic result, asymmetry, infection, wound healing problems, blood clots in legs or lungs reviewed. Plan heparin preop. Plan post op Lovenox for week.   Drain teaching completed. Rx for Lovenox and tramadol given.

## 2022-04-15 ENCOUNTER — Encounter (HOSPITAL_BASED_OUTPATIENT_CLINIC_OR_DEPARTMENT_OTHER): Payer: Self-pay | Admitting: Plastic Surgery

## 2022-04-15 ENCOUNTER — Other Ambulatory Visit: Payer: Self-pay

## 2022-04-20 MED ORDER — CHLORHEXIDINE GLUCONATE CLOTH 2 % EX PADS: 6.0000 | MEDICATED_PAD | Freq: Once | CUTANEOUS | Status: DC

## 2022-04-20 NOTE — Progress Notes (Signed)
       Patient Instructions  The night before surgery:  No food after midnight. ONLY clear liquids after midnight  The day of surgery (if you do NOT have diabetes):  Drink ONE (1) Pre-Surgery Clear Ensure as directed.   This drink was given to you during your hospital  pre-op appointment visit. The pre-op nurse will instruct you on the time to drink the  Pre-Surgery Ensure depending on your surgery time. Finish the drink at the designated time by the pre-op nurse.  Nothing else to drink after completing the  Pre-Surgery Clear Ensure.  The day of surgery (if you have diabetes): Drink ONE (1) Gatorade 2 (G2) as directed. This drink was given to you during your hospital  pre-op appointment visit.  The pre-op nurse will instruct you on the time to drink the   Gatorade 2 (G2) depending on your surgery time. Color of the Gatorade may vary. Red is not allowed. Nothing else to drink after completing the  Gatorade 2 (G2).         If you have questions, please contact your surgeon's office.  Surgical soap given to patient, instructions given, patient verbalized understanding.  

## 2022-04-27 ENCOUNTER — Other Ambulatory Visit: Payer: Self-pay

## 2022-04-27 ENCOUNTER — Encounter (HOSPITAL_BASED_OUTPATIENT_CLINIC_OR_DEPARTMENT_OTHER)
Admission: RE | Disposition: A | Discharge status: Home / Self Care | Payer: Self-pay | Source: Home / Self Care | Attending: Plastic Surgery

## 2022-04-27 ENCOUNTER — Encounter (HOSPITAL_BASED_OUTPATIENT_CLINIC_OR_DEPARTMENT_OTHER): Payer: Self-pay | Admitting: Plastic Surgery

## 2022-04-27 ENCOUNTER — Ambulatory Visit (HOSPITAL_BASED_OUTPATIENT_CLINIC_OR_DEPARTMENT_OTHER): Payer: BC Managed Care – PPO | Admitting: Certified Registered"

## 2022-04-27 ENCOUNTER — Ambulatory Visit (HOSPITAL_BASED_OUTPATIENT_CLINIC_OR_DEPARTMENT_OTHER)
Admission: RE | Admit: 2022-04-27 | Discharge: 2022-04-28 | Disposition: A | Discharge status: Home / Self Care | Payer: BC Managed Care – PPO | Attending: Plastic Surgery | Admitting: Plastic Surgery

## 2022-04-27 DIAGNOSIS — M793 Panniculitis, unspecified: Secondary | ICD-10-CM | POA: Diagnosis not present

## 2022-04-27 DIAGNOSIS — Z9884 Bariatric surgery status: Secondary | ICD-10-CM | POA: Insufficient documentation

## 2022-04-27 DIAGNOSIS — D6852 Prothrombin gene mutation: Secondary | ICD-10-CM | POA: Diagnosis not present

## 2022-04-27 HISTORY — PX: PANNICULECTOMY: SHX5360

## 2022-04-27 LAB — POCT PREGNANCY, URINE: Preg Test, Ur: NEGATIVE

## 2022-04-27 SURGERY — PANNICULECTOMY
Anesthesia: General | Site: Abdomen

## 2022-04-27 MED ORDER — LIDOCAINE HCL (CARDIAC) PF 100 MG/5ML IV SOSY
PREFILLED_SYRINGE | INTRAVENOUS | Status: DC | PRN
  Administered 2022-04-27: 60 mg via INTRAVENOUS

## 2022-04-27 MED ORDER — FENTANYL CITRATE (PF) 100 MCG/2ML IJ SOLN
25.0000 ug | INTRAMUSCULAR | Status: DC | PRN
  Administered 2022-04-27: 50 ug via INTRAVENOUS

## 2022-04-27 MED ORDER — BUPIVACAINE HCL (PF) 0.5 % IJ SOLN
INTRAMUSCULAR | Status: DC | PRN
  Administered 2022-04-27: 30 mL

## 2022-04-27 MED ORDER — CEFAZOLIN SODIUM-DEXTROSE 2-4 GM/100ML-% IV SOLN
2.0000 g | INTRAVENOUS | Status: AC
  Administered 2022-04-27: 2 g via INTRAVENOUS

## 2022-04-27 MED ORDER — DEXAMETHASONE SODIUM PHOSPHATE 4 MG/ML IJ SOLN
INTRAMUSCULAR | Status: DC | PRN
  Administered 2022-04-27: 5 mg via INTRAVENOUS

## 2022-04-27 MED ORDER — PROPOFOL 500 MG/50ML IV EMUL
INTRAVENOUS | Status: DC | PRN
  Administered 2022-04-27: 25 ug/kg/min via INTRAVENOUS

## 2022-04-27 MED ORDER — HEPARIN SODIUM (PORCINE) 5000 UNIT/ML IJ SOLN
INTRAMUSCULAR | Status: AC
  Filled 2022-04-27: qty 1

## 2022-04-27 MED ORDER — ONDANSETRON HCL 4 MG/2ML IJ SOLN
INTRAMUSCULAR | Status: DC | PRN
  Administered 2022-04-27: 4 mg via INTRAVENOUS

## 2022-04-27 MED ORDER — ROCURONIUM BROMIDE 100 MG/10ML IV SOLN
INTRAVENOUS | Status: DC | PRN
  Administered 2022-04-27: 70 mg via INTRAVENOUS

## 2022-04-27 MED ORDER — DEXAMETHASONE SODIUM PHOSPHATE 10 MG/ML IJ SOLN
INTRAMUSCULAR | Status: AC
  Filled 2022-04-27: qty 1

## 2022-04-27 MED ORDER — TRAMADOL HCL 50 MG PO TABS
50.0000 mg | ORAL_TABLET | Freq: Four times a day (QID) | ORAL | Status: DC | PRN
  Administered 2022-04-28: 50 mg via ORAL
  Filled 2022-04-27: qty 1

## 2022-04-27 MED ORDER — ONDANSETRON HCL 4 MG/2ML IJ SOLN: 4.0000 mg | Freq: Four times a day (QID) | INTRAMUSCULAR | Status: DC | PRN

## 2022-04-27 MED ORDER — ONDANSETRON HCL 4 MG/2ML IJ SOLN
INTRAMUSCULAR | Status: AC
  Filled 2022-04-27: qty 8

## 2022-04-27 MED ORDER — CEFAZOLIN SODIUM-DEXTROSE 2-4 GM/100ML-% IV SOLN
INTRAVENOUS | Status: AC
  Filled 2022-04-27: qty 100

## 2022-04-27 MED ORDER — GABAPENTIN 300 MG PO CAPS
300.0000 mg | ORAL_CAPSULE | ORAL | Status: AC
  Administered 2022-04-27: 300 mg via ORAL

## 2022-04-27 MED ORDER — FENTANYL CITRATE (PF) 100 MCG/2ML IJ SOLN
INTRAMUSCULAR | Status: AC
  Filled 2022-04-27: qty 2

## 2022-04-27 MED ORDER — HYDROMORPHONE HCL 1 MG/ML IJ SOLN: 0.5000 mg | INTRAMUSCULAR | Status: DC | PRN

## 2022-04-27 MED ORDER — ONDANSETRON 4 MG PO TBDP
4.0000 mg | ORAL_TABLET | Freq: Four times a day (QID) | ORAL | Status: DC | PRN
  Administered 2022-04-27 (×2): 4 mg via ORAL
  Filled 2022-04-27 (×2): qty 1

## 2022-04-27 MED ORDER — LACTATED RINGERS IV SOLN: INTRAVENOUS | Status: DC

## 2022-04-27 MED ORDER — KETAMINE HCL 50 MG/ML IJ SOLN
INTRAMUSCULAR | Status: DC | PRN
  Administered 2022-04-27 (×4): 10 mg via INTRAMUSCULAR

## 2022-04-27 MED ORDER — PROPOFOL 10 MG/ML IV BOLUS
INTRAVENOUS | Status: DC | PRN
  Administered 2022-04-27: 100 mg via INTRAVENOUS

## 2022-04-27 MED ORDER — CELECOXIB 200 MG PO CAPS
200.0000 mg | ORAL_CAPSULE | ORAL | Status: AC
  Administered 2022-04-27: 200 mg via ORAL

## 2022-04-27 MED ORDER — CELECOXIB 200 MG PO CAPS
ORAL_CAPSULE | ORAL | Status: AC
  Filled 2022-04-27: qty 1

## 2022-04-27 MED ORDER — KCL IN DEXTROSE-NACL 20-5-0.45 MEQ/L-%-% IV SOLN
INTRAVENOUS | Status: DC
  Filled 2022-04-27: qty 1000

## 2022-04-27 MED ORDER — SUGAMMADEX SODIUM 200 MG/2ML IV SOLN
INTRAVENOUS | Status: DC | PRN
  Administered 2022-04-27: 200 mg via INTRAVENOUS

## 2022-04-27 MED ORDER — MIDAZOLAM HCL 5 MG/5ML IJ SOLN
INTRAMUSCULAR | Status: DC | PRN
  Administered 2022-04-27: 2 mg via INTRAVENOUS

## 2022-04-27 MED ORDER — ACETAMINOPHEN 500 MG PO TABS
ORAL_TABLET | ORAL | Status: AC
  Filled 2022-04-27: qty 2

## 2022-04-27 MED ORDER — GABAPENTIN 300 MG PO CAPS
ORAL_CAPSULE | ORAL | Status: AC
  Filled 2022-04-27: qty 1

## 2022-04-27 MED ORDER — ACETAMINOPHEN 500 MG PO TABS
1000.0000 mg | ORAL_TABLET | ORAL | Status: AC
  Administered 2022-04-27: 1000 mg via ORAL

## 2022-04-27 MED ORDER — BUPIVACAINE HCL (PF) 0.5 % IJ SOLN
INTRAMUSCULAR | Status: AC
  Filled 2022-04-27: qty 30

## 2022-04-27 MED ORDER — ROCURONIUM BROMIDE 10 MG/ML (PF) SYRINGE
PREFILLED_SYRINGE | INTRAVENOUS | Status: AC
  Filled 2022-04-27: qty 20

## 2022-04-27 MED ORDER — ENOXAPARIN SODIUM 40 MG/0.4ML IJ SOSY
40.0000 mg | PREFILLED_SYRINGE | INTRAMUSCULAR | Status: DC
  Administered 2022-04-28: 40 mg via SUBCUTANEOUS
  Filled 2022-04-27: qty 0.4

## 2022-04-27 MED ORDER — OXYCODONE HCL 5 MG PO TABS
5.0000 mg | ORAL_TABLET | ORAL | Status: DC | PRN
  Administered 2022-04-27 (×2): 5 mg via ORAL
  Administered 2022-04-27: 10 mg via ORAL
  Filled 2022-04-27 (×2): qty 1
  Filled 2022-04-27: qty 2

## 2022-04-27 MED ORDER — LIDOCAINE 2% (20 MG/ML) 5 ML SYRINGE
INTRAMUSCULAR | Status: AC
  Filled 2022-04-27: qty 10

## 2022-04-27 MED ORDER — FENTANYL CITRATE (PF) 100 MCG/2ML IJ SOLN
INTRAMUSCULAR | Status: DC | PRN
  Administered 2022-04-27: 50 ug via INTRAVENOUS
  Administered 2022-04-27 (×2): 25 ug via INTRAVENOUS
  Administered 2022-04-27 (×2): 50 ug via INTRAVENOUS

## 2022-04-27 MED ORDER — MIDAZOLAM HCL 2 MG/2ML IJ SOLN
INTRAMUSCULAR | Status: AC
  Filled 2022-04-27: qty 2

## 2022-04-27 MED ORDER — HEPARIN SODIUM (PORCINE) 5000 UNIT/ML IJ SOLN
5000.0000 [IU] | Freq: Once | INTRAMUSCULAR | Status: AC
  Administered 2022-04-27: 5000 [IU] via SUBCUTANEOUS

## 2022-04-27 MED ORDER — KETAMINE HCL 50 MG/5ML IJ SOSY
PREFILLED_SYRINGE | INTRAMUSCULAR | Status: AC
  Filled 2022-04-27: qty 5

## 2022-04-27 MED ORDER — METHOCARBAMOL 500 MG PO TABS: 500.0000 mg | ORAL_TABLET | Freq: Four times a day (QID) | ORAL | Status: DC | PRN

## 2022-04-27 SURGICAL SUPPLY — 58 items
ADH SKN CLS APL DERMABOND .7 (GAUZE/BANDAGES/DRESSINGS) ×2
APL PRP STRL LF DISP 70% ISPRP (MISCELLANEOUS) ×1
APPLIER CLIP 9.375 MED OPEN (MISCELLANEOUS) ×2
APR CLP MED 9.3 20 MLT OPN (MISCELLANEOUS) ×2
BINDER ABDOMINAL 10 UNV 27-48 (MISCELLANEOUS) IMPLANT
BINDER ABDOMINAL 12 SM 30-45 (SOFTGOODS) IMPLANT
BLADE CLIPPER SURG (BLADE) IMPLANT
BLADE SURG 10 STRL SS (BLADE) ×2 IMPLANT
BLADE SURG 11 STRL SS (BLADE) ×1 IMPLANT
BLADE SURG 15 STRL LF DISP TIS (BLADE) IMPLANT
BLADE SURG 15 STRL SS (BLADE)
CANISTER SUCT 1200ML W/VALVE (MISCELLANEOUS) ×1 IMPLANT
CHLORAPREP W/TINT 26 (MISCELLANEOUS) ×1 IMPLANT
CLIP APPLIE 9.375 MED OPEN (MISCELLANEOUS) ×1 IMPLANT
COVER BACK TABLE 60X90IN (DRAPES) ×1 IMPLANT
COVER MAYO STAND STRL (DRAPES) ×1 IMPLANT
DERMABOND ADVANCED .7 DNX12 (GAUZE/BANDAGES/DRESSINGS) ×2 IMPLANT
DRAIN CHANNEL 15F RND FF W/TCR (WOUND CARE) ×2 IMPLANT
DRAIN CHANNEL 19F RND (DRAIN) IMPLANT
DRAPE TOP ARMCOVERS (MISCELLANEOUS) ×1 IMPLANT
DRAPE U-SHAPE 76X120 STRL (DRAPES) ×1 IMPLANT
DRAPE UTILITY XL STRL (DRAPES) ×1 IMPLANT
ELECT BLADE 4.0 EZ CLEAN MEGAD (MISCELLANEOUS)
ELECT COATED BLADE 2.86 ST (ELECTRODE) IMPLANT
ELECT REM PT RETURN 9FT ADLT (ELECTROSURGICAL) ×1
ELECTRODE BLDE 4.0 EZ CLN MEGD (MISCELLANEOUS) IMPLANT
ELECTRODE REM PT RTRN 9FT ADLT (ELECTROSURGICAL) ×1 IMPLANT
EVACUATOR SILICONE 100CC (DRAIN) ×2 IMPLANT
GAUZE PAD ABD 8X10 STRL (GAUZE/BANDAGES/DRESSINGS) ×2 IMPLANT
GAUZE XEROFORM 1X8 LF (GAUZE/BANDAGES/DRESSINGS) IMPLANT
GLOVE BIO SURGEON STRL SZ 6 (GLOVE) ×3 IMPLANT
GOWN STRL REUS W/ TWL LRG LVL3 (GOWN DISPOSABLE) ×2 IMPLANT
GOWN STRL REUS W/TWL LRG LVL3 (GOWN DISPOSABLE) ×2
HEMOSTAT ARISTA ABSORB 3G PWDR (HEMOSTASIS) IMPLANT
NDL HYPO 25X1 1.5 SAFETY (NEEDLE) IMPLANT
NEEDLE HYPO 25X1 1.5 SAFETY (NEEDLE) IMPLANT
NS IRRIG 1000ML POUR BTL (IV SOLUTION) ×1 IMPLANT
PACK BASIN DAY SURGERY FS (CUSTOM PROCEDURE TRAY) ×1 IMPLANT
PENCIL SMOKE EVACUATOR (MISCELLANEOUS) ×1 IMPLANT
PIN SAFETY STERILE (MISCELLANEOUS) ×1 IMPLANT
SHEET MEDIUM DRAPE 40X70 STRL (DRAPES) ×2 IMPLANT
SLEEVE SCD COMPRESS KNEE MED (STOCKING) ×1 IMPLANT
SPONGE T-LAP 18X18 ~~LOC~~+RFID (SPONGE) ×2 IMPLANT
STAPLER VISISTAT 35W (STAPLE) ×1 IMPLANT
SUT ETHILON 2 0 FS 18 (SUTURE) ×2 IMPLANT
SUT MNCRL AB 4-0 PS2 18 (SUTURE) ×2 IMPLANT
SUT PDS AB 0 CT 36 (SUTURE) ×1 IMPLANT
SUT PDS AB 2-0 CT2 27 (SUTURE) IMPLANT
SUT PLAIN 5 0 P 3 18 (SUTURE) IMPLANT
SUT VICRYL 0 CT-2 (SUTURE) IMPLANT
SUT VLOC 180 0 24IN GS25 (SUTURE) ×1 IMPLANT
SYR BULB IRRIG 60ML STRL (SYRINGE) ×1 IMPLANT
SYR CONTROL 10ML LL (SYRINGE) IMPLANT
TOWEL GREEN STERILE FF (TOWEL DISPOSABLE) ×1 IMPLANT
TRAY FOLEY W/BAG SLVR 14FR LF (SET/KITS/TRAYS/PACK) IMPLANT
TUBE CONNECTING 20X1/4 (TUBING) ×1 IMPLANT
UNDERPAD 30X36 HEAVY ABSORB (UNDERPADS AND DIAPERS) ×2 IMPLANT
YANKAUER SUCT BULB TIP NO VENT (SUCTIONS) ×1 IMPLANT

## 2022-04-27 NOTE — Op Note (Signed)
Operative Note   DATE OF OPERATION: 11.28.23  LOCATION: [facility]-observation  SURGICAL DIVISION: Plastic Surgery  PREOPERATIVE DIAGNOSES:  Panniculitis  POSTOPERATIVE DIAGNOSES:  same  PROCEDURE:  Panniculectomy  SURGEON: Irene Limbo MD MBA  ASSISTANT: none  ANESTHESIA:  General.   EBL: 20 ml  COMPLICATIONS: None immediate.   INDICATIONS FOR PROCEDURE:  The patient, [name], is a 41 y.o. female born on 11-10-1980, is here for treatment recurrent panniculitis following bariatric surgery that has failed conservative measures   FINDINGS: Abdominal soft tissue resection 2438 g  DESCRIPTION OF PROCEDURE:  The patient's caudal incision was marked over abdomen marked 7 cm from labial fourchette and extended over lateral abdomen. SQ heparin administered. The patient was taken to the operating room. SCDs were placed and IV antibiotics were given. The patient's operative site was prepped and draped in a sterile fashion. A time out was performed and all information was confirmed to be correct.     Low transverse abdominal incision carried through superficial fascia to abdominal wall. Skin flap elevated in sub Scarpa's layer, taking care to leave layer of subfascial fat over abdominal wall fascia. Dissection completed toward umbilicus. Umbilicus sharply incised and scissor dissection completed to free from abdominal skin flap. Additional dissection completed in midline toward xiphoid. Wound irrigated and hemostasis obtained. Local anesthetic infiltrated. Diastasis recti imbricated with 0 V lock suture over both supra and infraumbilical abdomen. Patient then brought to semi sitting position. Caudal extent skin excision marked by palpation. Area marked excised. 62 Fr JP placed in subcutaneous right and left abdomen and secured with 2-0 nylon. Superiorly based U shaped skin flap incised for delivery umbilicus. Low transverse abdominal skin incision closed with 0 PDS in  superficial fascia. 0 V lock used to close dermis and 4-0 monocryl for subcuticular skin closure. Umbilicus inset with 4-0 monocryl in dermis and 5-0 plain gut for skin closure. Xeroform bolster placed within umbilicus. Dermabond applied.   Dry dressing and abdominal binder applied. The patient was allowed to wake from anesthesia, extubated and taken to the recovery room in satisfactory condition.   SPECIMENS: none  DRAINS: 41 Fr JP in right and left subcutaneous abdomen  Irene Limbo, MD [facility]  Office/ physician access line after hours [phone]

## 2022-04-27 NOTE — Transfer of Care (Signed)
Immediate Anesthesia Transfer of Care Note  Patient: [name]  Procedure(s) Performed: PANNICULECTOMY (Abdomen)  Patient Location: PACU  Anesthesia Type:General  Level of Consciousness: drowsy and patient cooperative  Airway & Oxygen Therapy: Patient Spontanous Breathing and Patient connected to face mask oxygen  Post-op Assessment: Report given to RN and Post -op Vital signs reviewed and stable  Post vital signs: Reviewed and stable  Last Vitals:  Vitals Value Taken Time  BP    Temp    Pulse 76 04/27/22 1020  Resp    SpO2 100 % 04/27/22 1020  Vitals shown include unvalidated device data.  Last Pain:  Vitals:   04/27/22 0653  TempSrc: Oral  PainSc: 0-No pain         Complications: No notable events documented.

## 2022-04-27 NOTE — Discharge Instructions (Signed)

## 2022-04-27 NOTE — Anesthesia Postprocedure Evaluation (Signed)
Anesthesia Post Note  Patient: [name]  Procedure(s) Performed: PANNICULECTOMY (Abdomen)     Patient location during evaluation: PACU Anesthesia Type: General Level of consciousness: awake and alert Pain management: pain level controlled Vital Signs Assessment: post-procedure vital signs reviewed and stable Respiratory status: spontaneous breathing, nonlabored ventilation, respiratory function stable and patient connected to nasal cannula oxygen Cardiovascular status: blood pressure returned to baseline and stable Postop Assessment: no apparent nausea or vomiting Anesthetic complications: no  No notable events documented.  Last Vitals:  Vitals:   04/27/22 1045 04/27/22 1100  BP: 118/77 113/74  Pulse: 72 68  Resp: 15 15  Temp:    SpO2: 97% 98%    Last Pain:  Vitals:   04/27/22 1110  TempSrc:   PainSc: 3                  Takari Lundahl L Aliannah Holstrom

## 2022-04-27 NOTE — Interval H&P Note (Signed)
History and Physical Interval Note:  04/27/2022 6:55 AM  [name]  has presented today for surgery, with the diagnosis of panniculitis, hx bariatric surgery.  The various methods of treatment have been discussed with the patient and family. After consideration of risks, benefits and other options for treatment, the patient has consented to  Procedure(s): PANNICULECTOMY (N/A) as a surgical intervention.  The patient's history has been reviewed, patient examined, no change in status, stable for surgery.  I have reviewed the patient's chart and labs.  Questions were answered to the patient's satisfaction.     Arnoldo Hooker Julien Oscar

## 2022-04-27 NOTE — Anesthesia Procedure Notes (Signed)
Procedure Name: Intubation Date/Time: 04/27/2022 7:34 AM  Performed by: Xaiver Roskelley, Ernesta Amble, CRNAPre-anesthesia Checklist: Patient identified, Emergency Drugs available, Suction available and Patient being monitored Patient Re-evaluated:Patient Re-evaluated prior to induction Oxygen Delivery Method: Circle system utilized Preoxygenation: Pre-oxygenation with 100% oxygen Induction Type: IV induction Ventilation: Mask ventilation without difficulty Laryngoscope Size: Mac and 3 Grade View: Grade I Tube type: Oral Tube size: 7.0 mm Number of attempts: 1 Airway Equipment and Method: Stylet and Oral airway Placement Confirmation: ETT inserted through vocal cords under direct vision, positive ETCO2 and breath sounds checked- equal and bilateral Tube secured with: Tape Dental Injury: Teeth and Oropharynx as per pre-operative assessment

## 2022-04-27 NOTE — Anesthesia Preprocedure Evaluation (Addendum)
Anesthesia Evaluation  Patient identified by MRN, date of birth, ID band Patient awake    Reviewed: Allergy & Precautions, NPO status , Patient's Chart, lab work & pertinent test results  History of Anesthesia Complications (+) PONV and history of anesthetic complications  Airway Mallampati: II  TM Distance: >3 FB Neck ROM: Full    Dental no notable dental hx.    Pulmonary neg pulmonary ROS   Pulmonary exam normal breath sounds clear to auscultation       Cardiovascular negative cardio ROS Normal cardiovascular exam Rhythm:Regular Rate:Normal     Neuro/Psych negative neurological ROS  negative psych ROS   GI/Hepatic Neg liver ROS,GERD  ,,  Endo/Other  negative endocrine ROS    Renal/GU negative Renal ROS  negative genitourinary   Musculoskeletal negative musculoskeletal ROS (+)    Abdominal   Peds  Hematology  (+) Blood dyscrasia (prothrombin gene mutation)   Anesthesia Other Findings   Reproductive/Obstetrics                             Anesthesia Physical Anesthesia Plan  ASA: 2  Anesthesia Plan: General   Post-op Pain Management: Tylenol PO (pre-op)* and Ketamine IV*   Induction: Intravenous  PONV Risk Score and Plan: 4 or greater and Midazolam, Dexamethasone, Ondansetron and Propofol infusion  Airway Management Planned: Oral ETT and LMA  Additional Equipment:   Intra-op Plan:   Post-operative Plan: Extubation in OR  Informed Consent: I have reviewed the patients History and Physical, chart, labs and discussed the procedure including the risks, benefits and alternatives for the proposed anesthesia with the patient or authorized representative who has indicated his/her understanding and acceptance.     Dental advisory given  Plan Discussed with: CRNA  Anesthesia Plan Comments:        Anesthesia Quick Evaluation

## 2022-04-28 ENCOUNTER — Encounter (HOSPITAL_BASED_OUTPATIENT_CLINIC_OR_DEPARTMENT_OTHER): Payer: Self-pay | Admitting: Plastic Surgery

## 2022-04-28 DIAGNOSIS — Z9884 Bariatric surgery status: Secondary | ICD-10-CM | POA: Diagnosis not present

## 2022-04-28 DIAGNOSIS — D6852 Prothrombin gene mutation: Secondary | ICD-10-CM | POA: Diagnosis not present

## 2022-04-28 DIAGNOSIS — M793 Panniculitis, unspecified: Secondary | ICD-10-CM | POA: Diagnosis not present

## 2022-04-28 NOTE — Discharge Summary (Signed)
Physician Discharge Summary  Patient ID: [name] MRN: [id number] DOB/AGE: [birth year] 40 y.o.  Admit date: 04/27/2022 Discharge date: 04/28/2022  Admission Diagnoses: Panniculitis  Discharge Diagnoses:  Principal Problem:   Panniculitis   Discharged Condition: stable  Hospital Course: Post operatively patient did well with pain controlled, ambulatory with minimal assist and tolerating diet. Patient instructed on bathing drain care and activity.   Treatments: surgery: panniculectomy 11.28.23  Discharge Exam: Blood pressure (!) 100/57, pulse 78, temperature 98.2 F (36.8 C), resp. rate 16, height '5\' 4"'$  (1.626 m), weight 81 kg, last menstrual period 04/19/2022, SpO2 100 %, not currently breastfeeding. Incision/Wound: incisions intact abdomen soft umbilicus viable drains serosanguinous  Disposition: Discharge disposition: 01-Home or Self Care       Discharge Instructions     Call MD for:  redness, tenderness, or signs of infection (pain, swelling, bleeding, redness, odor or green/yellow discharge around incision site)   Complete by: As directed    Call MD for:  temperature >100.5   Complete by: As directed    Discharge instructions   Complete by: As directed    Ok to remove dressings and shower am 11.30.23. Soap and water ok, pat incisions dry. No creams or ointments over incisions. Do not let drains dangle in shower, attach to lanyard or similar.Strip and record drains twice daily and bring log to clinic visit.  Abdominal binder or compression garment all other times.  Ok to raise arms above shoulders for bathing and dressing.  No house yard work or exercise until cleared by MD.   Patient received all Rx preop. Also ok to use Tylenol for pain. Recommend Miralax or Dulcolax as needed for constipation.  Do no lie flat through follow up visit. Sleep with head elevated on 2-3 pillows and pillow beneath knees. Ambulate bent at hip.   Driving Restrictions   Complete by:  As directed    No driving if taking prescription pain medication   Lifting restrictions   Complete by: As directed    No lifting > 5-10 lbs until cleared by MD   Resume previous diet   Complete by: As directed       Allergies as of 04/28/2022   No Known Allergies      Medication List     TAKE these medications    Calcium-Vitamin D 600-5 MG-MCG Tabs Take by mouth.   enoxaparin 30 MG/0.3ML injection Commonly known as: LOVENOX Inject 0.4 mLs (40 mg total) into the skin every 12 (twelve) hours.   ferrous sulfate 325 (65 FE) MG tablet Take 325 mg by mouth daily with breakfast.   multivitamin with minerals Tabs tablet Take 1 tablet by mouth daily.   ondansetron 4 MG tablet Commonly known as: ZOFRAN Take 4 mg by mouth every 8 (eight) hours as needed for nausea or vomiting.        Follow-up Information     Irene Limbo, MD Follow up in 1 week(s).   Specialty: Plastic Surgery Why: as scheduled Contact information: [address] [place] [address] [phone]                 Signed: Irene Limbo 04/28/2022, 7:47 AM

## 2022-05-06 DIAGNOSIS — F900 Attention-deficit hyperactivity disorder, predominantly inattentive type: Secondary | ICD-10-CM | POA: Diagnosis not present

## 2022-05-26 DIAGNOSIS — D225 Melanocytic nevi of trunk: Secondary | ICD-10-CM | POA: Diagnosis not present

## 2022-05-26 DIAGNOSIS — D2239 Melanocytic nevi of other parts of face: Secondary | ICD-10-CM | POA: Diagnosis not present

## 2022-08-03 ENCOUNTER — Encounter: Payer: Self-pay | Admitting: *Deleted
